# Patient Record
Sex: Female | Born: 1935 | Race: Black or African American | Hispanic: No | Smoking: Never smoker
Health system: Southern US, Community
[De-identification: ages and names within clinical notes are randomized; demographics above are authoritative.]

## PROBLEM LIST (undated history)

## (undated) DIAGNOSIS — Z9981 Dependence on supplemental oxygen: Secondary | ICD-10-CM

## (undated) DIAGNOSIS — N183 Chronic kidney disease, stage 3 unspecified: Secondary | ICD-10-CM

## (undated) DIAGNOSIS — Z8719 Personal history of other diseases of the digestive system: Secondary | ICD-10-CM

## (undated) DIAGNOSIS — J449 Chronic obstructive pulmonary disease, unspecified: Secondary | ICD-10-CM

## (undated) DIAGNOSIS — K219 Gastro-esophageal reflux disease without esophagitis: Secondary | ICD-10-CM

## (undated) DIAGNOSIS — L89309 Pressure ulcer of unspecified buttock, unspecified stage: Secondary | ICD-10-CM

## (undated) DIAGNOSIS — J189 Pneumonia, unspecified organism: Secondary | ICD-10-CM

## (undated) DIAGNOSIS — I10 Essential (primary) hypertension: Secondary | ICD-10-CM

## (undated) DIAGNOSIS — D649 Anemia, unspecified: Secondary | ICD-10-CM

## (undated) DIAGNOSIS — Z8711 Personal history of peptic ulcer disease: Secondary | ICD-10-CM

## (undated) DIAGNOSIS — F329 Major depressive disorder, single episode, unspecified: Secondary | ICD-10-CM

## (undated) DIAGNOSIS — R0602 Shortness of breath: Secondary | ICD-10-CM

## (undated) DIAGNOSIS — G8929 Other chronic pain: Secondary | ICD-10-CM

## (undated) DIAGNOSIS — M6281 Muscle weakness (generalized): Secondary | ICD-10-CM

## (undated) DIAGNOSIS — I509 Heart failure, unspecified: Secondary | ICD-10-CM

## (undated) DIAGNOSIS — I82409 Acute embolism and thrombosis of unspecified deep veins of unspecified lower extremity: Secondary | ICD-10-CM

## (undated) DIAGNOSIS — F419 Anxiety disorder, unspecified: Secondary | ICD-10-CM

## (undated) DIAGNOSIS — E119 Type 2 diabetes mellitus without complications: Secondary | ICD-10-CM

## (undated) DIAGNOSIS — Z9289 Personal history of other medical treatment: Secondary | ICD-10-CM

## (undated) DIAGNOSIS — F0390 Unspecified dementia without behavioral disturbance: Secondary | ICD-10-CM

## (undated) DIAGNOSIS — F32A Depression, unspecified: Secondary | ICD-10-CM

## (undated) DIAGNOSIS — R0902 Hypoxemia: Secondary | ICD-10-CM

## (undated) DIAGNOSIS — M1990 Unspecified osteoarthritis, unspecified site: Secondary | ICD-10-CM

## (undated) HISTORY — DX: Hypoxemia: R09.02

## (undated) HISTORY — DX: Muscle weakness (generalized): M62.81

## (undated) HISTORY — DX: Major depressive disorder, single episode, unspecified: F32.9

## (undated) HISTORY — DX: Gastro-esophageal reflux disease without esophagitis: K21.9

## (undated) HISTORY — PX: HERNIA REPAIR: SHX51

## (undated) HISTORY — DX: Morbid (severe) obesity due to excess calories: E66.01

## (undated) HISTORY — DX: Depression, unspecified: F32.A

## (undated) HISTORY — DX: Anemia, unspecified: D64.9

## (undated) HISTORY — DX: Other chronic pain: G89.29

## (undated) HISTORY — PX: CHOLECYSTECTOMY: SHX55

## (undated) HISTORY — PX: ABDOMINAL HYSTERECTOMY: SHX81

## (undated) HISTORY — PX: TUBAL LIGATION: SHX77

---

## 2001-09-13 ENCOUNTER — Encounter: Payer: Self-pay | Admitting: *Deleted

## 2001-09-13 ENCOUNTER — Emergency Department (HOSPITAL_COMMUNITY): Admission: EM | Admit: 2001-09-13 | Discharge: 2001-09-13 | Payer: Self-pay | Admitting: *Deleted

## 2001-12-19 ENCOUNTER — Ambulatory Visit (HOSPITAL_COMMUNITY): Admission: RE | Admit: 2001-12-19 | Discharge: 2001-12-19 | Payer: Self-pay | Admitting: Internal Medicine

## 2001-12-19 ENCOUNTER — Encounter: Payer: Self-pay | Admitting: Internal Medicine

## 2001-12-28 ENCOUNTER — Encounter: Payer: Self-pay | Admitting: Internal Medicine

## 2001-12-28 ENCOUNTER — Emergency Department (HOSPITAL_COMMUNITY): Admission: EM | Admit: 2001-12-28 | Discharge: 2001-12-29 | Payer: Self-pay | Admitting: Internal Medicine

## 2002-12-20 ENCOUNTER — Ambulatory Visit (HOSPITAL_COMMUNITY): Admission: RE | Admit: 2002-12-20 | Discharge: 2002-12-20 | Payer: Self-pay | Admitting: Internal Medicine

## 2002-12-20 ENCOUNTER — Encounter: Payer: Self-pay | Admitting: Internal Medicine

## 2003-02-26 ENCOUNTER — Encounter: Payer: Self-pay | Admitting: Internal Medicine

## 2003-02-26 ENCOUNTER — Ambulatory Visit (HOSPITAL_COMMUNITY): Admission: RE | Admit: 2003-02-26 | Discharge: 2003-02-26 | Payer: Self-pay | Admitting: Internal Medicine

## 2003-03-09 ENCOUNTER — Inpatient Hospital Stay (HOSPITAL_COMMUNITY): Admission: EM | Admit: 2003-03-09 | Discharge: 2003-03-15 | Payer: Self-pay | Admitting: Emergency Medicine

## 2003-03-14 ENCOUNTER — Encounter: Payer: Self-pay | Admitting: Internal Medicine

## 2003-04-03 ENCOUNTER — Emergency Department (HOSPITAL_COMMUNITY): Admission: EM | Admit: 2003-04-03 | Discharge: 2003-04-03 | Payer: Self-pay | Admitting: *Deleted

## 2003-04-04 ENCOUNTER — Encounter (HOSPITAL_COMMUNITY): Admission: RE | Admit: 2003-04-04 | Discharge: 2003-04-05 | Payer: Self-pay | Admitting: *Deleted

## 2003-04-05 ENCOUNTER — Ambulatory Visit (HOSPITAL_COMMUNITY): Admission: RE | Admit: 2003-04-05 | Discharge: 2003-04-05 | Payer: Self-pay | Admitting: Internal Medicine

## 2003-04-05 ENCOUNTER — Encounter: Payer: Self-pay | Admitting: *Deleted

## 2003-06-04 ENCOUNTER — Inpatient Hospital Stay (HOSPITAL_COMMUNITY): Admission: EM | Admit: 2003-06-04 | Discharge: 2003-06-11 | Payer: Self-pay | Admitting: *Deleted

## 2003-12-25 ENCOUNTER — Encounter (HOSPITAL_COMMUNITY): Admission: RE | Admit: 2003-12-25 | Discharge: 2003-12-26 | Payer: Self-pay | Admitting: *Deleted

## 2004-02-28 ENCOUNTER — Ambulatory Visit (HOSPITAL_COMMUNITY): Admission: RE | Admit: 2004-02-28 | Discharge: 2004-02-28 | Payer: Self-pay | Admitting: Urology

## 2004-06-27 ENCOUNTER — Inpatient Hospital Stay (HOSPITAL_COMMUNITY): Admission: EM | Admit: 2004-06-27 | Discharge: 2004-07-01 | Payer: Self-pay | Admitting: Emergency Medicine

## 2004-07-04 ENCOUNTER — Emergency Department (HOSPITAL_COMMUNITY): Admission: EM | Admit: 2004-07-04 | Discharge: 2004-07-04 | Payer: Self-pay | Admitting: Emergency Medicine

## 2004-07-21 ENCOUNTER — Ambulatory Visit (HOSPITAL_COMMUNITY): Admission: RE | Admit: 2004-07-21 | Discharge: 2004-07-21 | Payer: Self-pay | Admitting: Internal Medicine

## 2004-07-30 ENCOUNTER — Ambulatory Visit: Payer: Self-pay | Admitting: Internal Medicine

## 2004-08-13 ENCOUNTER — Ambulatory Visit: Payer: Self-pay | Admitting: Internal Medicine

## 2004-08-13 ENCOUNTER — Ambulatory Visit (HOSPITAL_COMMUNITY): Admission: RE | Admit: 2004-08-13 | Discharge: 2004-08-13 | Payer: Self-pay | Admitting: Internal Medicine

## 2004-09-16 ENCOUNTER — Ambulatory Visit: Payer: Self-pay | Admitting: Internal Medicine

## 2004-10-03 ENCOUNTER — Ambulatory Visit: Payer: Self-pay | Admitting: Internal Medicine

## 2005-01-29 ENCOUNTER — Ambulatory Visit (HOSPITAL_COMMUNITY): Admission: RE | Admit: 2005-01-29 | Discharge: 2005-01-29 | Payer: Self-pay | Admitting: Internal Medicine

## 2005-03-23 ENCOUNTER — Ambulatory Visit (HOSPITAL_COMMUNITY): Payer: Self-pay | Admitting: Oncology

## 2005-03-23 ENCOUNTER — Encounter (HOSPITAL_COMMUNITY): Admission: RE | Admit: 2005-03-23 | Discharge: 2005-03-28 | Payer: Self-pay | Admitting: Oncology

## 2005-03-23 ENCOUNTER — Encounter: Admission: RE | Admit: 2005-03-23 | Discharge: 2005-03-28 | Payer: Self-pay

## 2005-05-25 ENCOUNTER — Encounter (HOSPITAL_COMMUNITY): Admission: RE | Admit: 2005-05-25 | Discharge: 2005-06-24 | Payer: Self-pay | Admitting: Oncology

## 2005-05-25 ENCOUNTER — Ambulatory Visit (HOSPITAL_COMMUNITY): Payer: Self-pay | Admitting: Oncology

## 2005-05-25 ENCOUNTER — Encounter: Admission: RE | Admit: 2005-05-25 | Discharge: 2005-05-25 | Payer: Self-pay | Admitting: Oncology

## 2005-07-21 ENCOUNTER — Ambulatory Visit (HOSPITAL_COMMUNITY): Admission: RE | Admit: 2005-07-21 | Discharge: 2005-07-21 | Payer: Self-pay | Admitting: General Surgery

## 2005-07-27 ENCOUNTER — Ambulatory Visit (HOSPITAL_COMMUNITY): Payer: Self-pay | Admitting: Oncology

## 2005-07-27 ENCOUNTER — Encounter: Admission: RE | Admit: 2005-07-27 | Discharge: 2005-07-27 | Payer: Self-pay | Admitting: Oncology

## 2005-08-06 ENCOUNTER — Ambulatory Visit (HOSPITAL_COMMUNITY): Admission: RE | Admit: 2005-08-06 | Discharge: 2005-08-06 | Payer: Self-pay | Admitting: Internal Medicine

## 2005-08-20 ENCOUNTER — Ambulatory Visit (HOSPITAL_COMMUNITY): Admission: RE | Admit: 2005-08-20 | Discharge: 2005-08-20 | Payer: Self-pay | Admitting: Internal Medicine

## 2005-09-15 ENCOUNTER — Ambulatory Visit (HOSPITAL_COMMUNITY): Payer: Self-pay | Admitting: Oncology

## 2005-09-15 ENCOUNTER — Encounter (HOSPITAL_COMMUNITY): Admission: RE | Admit: 2005-09-15 | Discharge: 2005-10-15 | Payer: Self-pay | Admitting: Oncology

## 2005-09-15 ENCOUNTER — Encounter: Admission: RE | Admit: 2005-09-15 | Discharge: 2005-09-15 | Payer: Self-pay | Admitting: Oncology

## 2005-10-29 ENCOUNTER — Encounter: Admission: RE | Admit: 2005-10-29 | Discharge: 2005-10-29 | Payer: Self-pay | Admitting: Oncology

## 2005-12-01 ENCOUNTER — Encounter (HOSPITAL_COMMUNITY): Admission: RE | Admit: 2005-12-01 | Discharge: 2005-12-31 | Payer: Self-pay | Admitting: Oncology

## 2005-12-01 ENCOUNTER — Encounter: Admission: RE | Admit: 2005-12-01 | Discharge: 2005-12-01 | Payer: Self-pay | Admitting: Oncology

## 2005-12-01 ENCOUNTER — Ambulatory Visit (HOSPITAL_COMMUNITY): Payer: Self-pay | Admitting: Oncology

## 2006-01-13 ENCOUNTER — Encounter: Admission: RE | Admit: 2006-01-13 | Discharge: 2006-01-13 | Payer: Self-pay | Admitting: Oncology

## 2006-02-10 ENCOUNTER — Ambulatory Visit (HOSPITAL_COMMUNITY): Admission: RE | Admit: 2006-02-10 | Discharge: 2006-02-10 | Payer: Self-pay | Admitting: Internal Medicine

## 2006-02-20 IMAGING — CR DG CHEST 1V PORT
1 series · 1 of 1 positions shown · non-contrast
Comparison: 07/04/04.

CLINICAL DATA: Port-A-Cath insertion.
 PORTABLE CHEST ? 1 VIEW ? 07/21/05 AT 2760 HOURS:

[view not recorded]
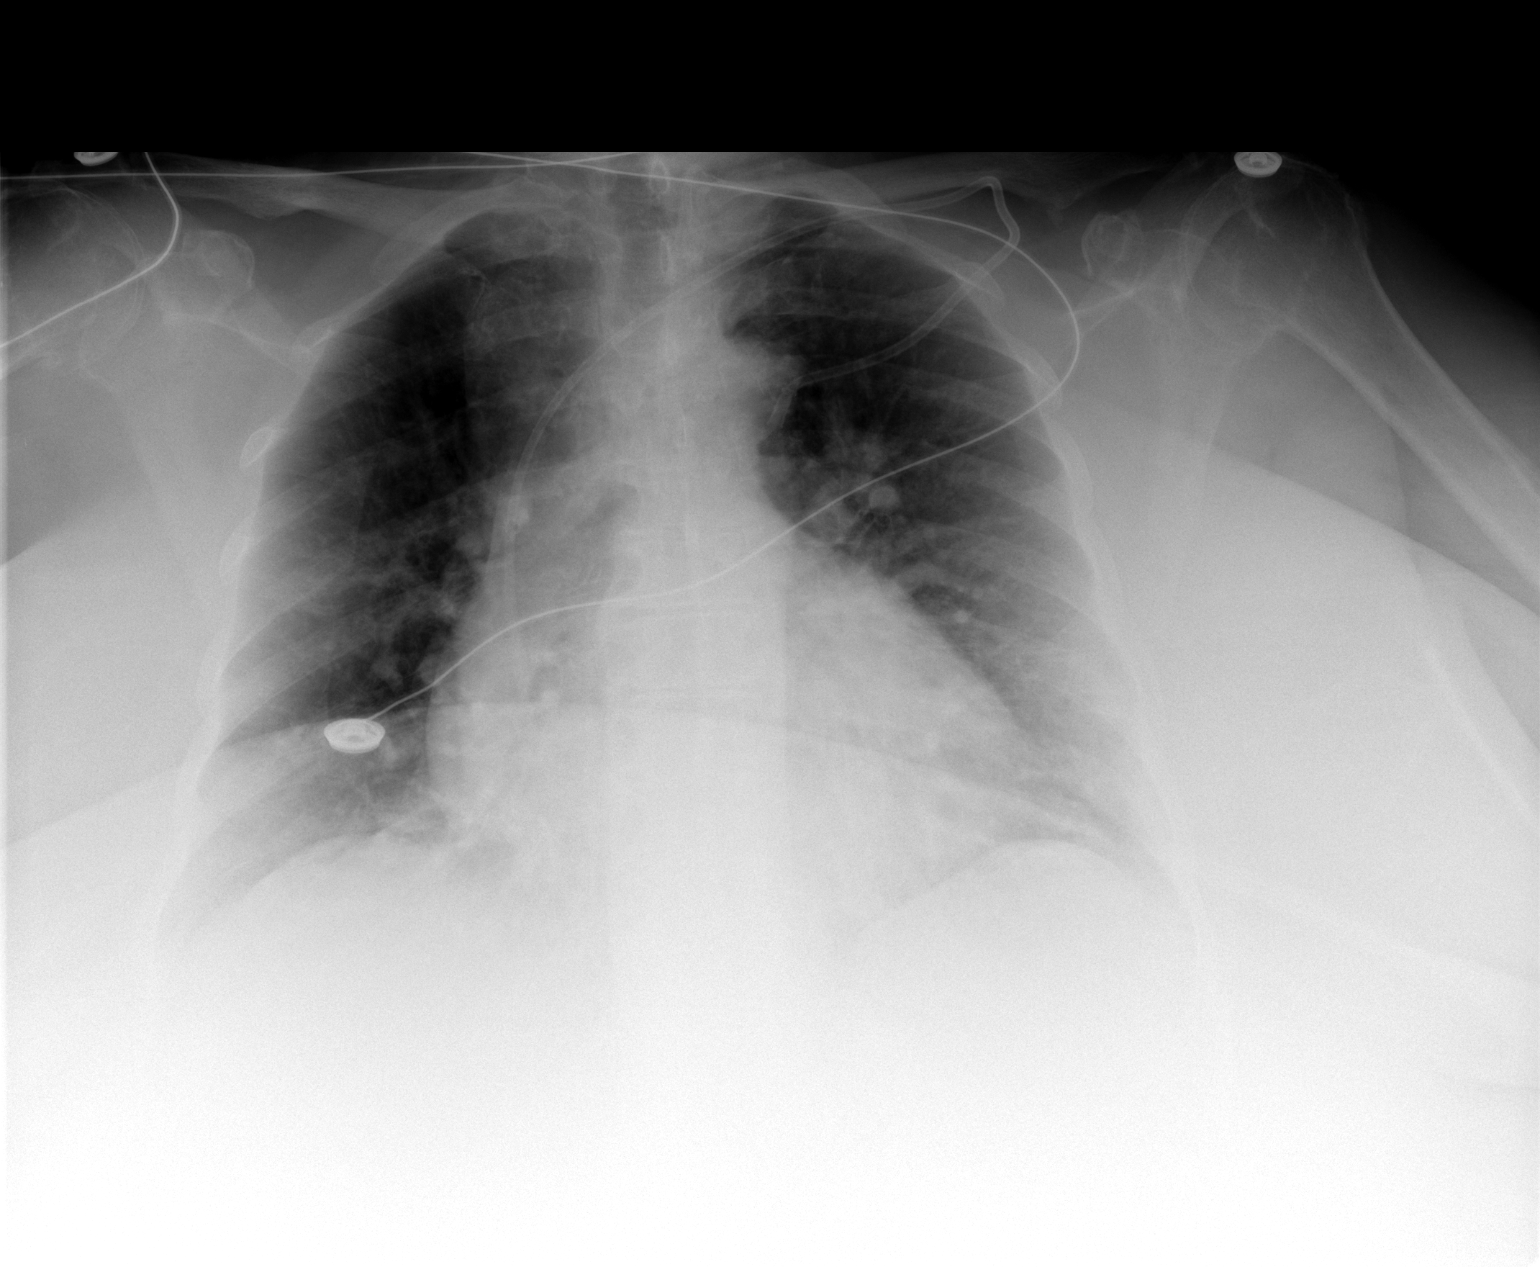

[1 of 1 positions shown; findings below may reference images not displayed]

FINDINGS: A left subclavian Port-A-Cath has been placed with the tip at the cavoatrial junction.  There is no pneumothorax.  
 The heart is enlarged.  There is pulmonary vascular congestion similar to the prior study.
IMPRESSION: 1.  Port-A-Cath placement in satisfactory position as above.  
 2.  Pulmonary vascular congestion again noted and unchanged.

## 2006-02-23 ENCOUNTER — Emergency Department (HOSPITAL_COMMUNITY): Admission: EM | Admit: 2006-02-23 | Discharge: 2006-02-23 | Payer: Self-pay | Admitting: Emergency Medicine

## 2006-03-10 ENCOUNTER — Ambulatory Visit (HOSPITAL_COMMUNITY): Payer: Self-pay | Admitting: Oncology

## 2006-03-10 ENCOUNTER — Ambulatory Visit (HOSPITAL_COMMUNITY): Admission: RE | Admit: 2006-03-10 | Discharge: 2006-03-10 | Payer: Self-pay | Admitting: Internal Medicine

## 2006-03-10 ENCOUNTER — Encounter: Admission: RE | Admit: 2006-03-10 | Discharge: 2006-03-26 | Payer: Self-pay | Admitting: Oncology

## 2006-03-10 ENCOUNTER — Encounter (HOSPITAL_COMMUNITY): Admission: RE | Admit: 2006-03-10 | Discharge: 2006-03-26 | Payer: Self-pay | Admitting: Oncology

## 2006-03-17 ENCOUNTER — Ambulatory Visit (HOSPITAL_COMMUNITY): Admission: RE | Admit: 2006-03-17 | Discharge: 2006-03-17 | Payer: Self-pay | Admitting: Internal Medicine

## 2006-04-21 ENCOUNTER — Encounter: Admission: RE | Admit: 2006-04-21 | Discharge: 2006-04-21 | Payer: Self-pay | Admitting: Oncology

## 2006-06-02 ENCOUNTER — Ambulatory Visit (HOSPITAL_COMMUNITY): Payer: Self-pay | Admitting: Oncology

## 2006-08-25 ENCOUNTER — Ambulatory Visit (HOSPITAL_COMMUNITY): Payer: Self-pay | Admitting: Oncology

## 2006-10-06 ENCOUNTER — Encounter (HOSPITAL_COMMUNITY): Admission: RE | Admit: 2006-10-06 | Discharge: 2006-11-05 | Payer: Self-pay | Admitting: Oncology

## 2006-11-16 ENCOUNTER — Ambulatory Visit (HOSPITAL_COMMUNITY): Payer: Self-pay | Admitting: Oncology

## 2006-11-23 ENCOUNTER — Emergency Department (HOSPITAL_COMMUNITY): Admission: EM | Admit: 2006-11-23 | Discharge: 2006-11-23 | Payer: Self-pay | Admitting: Emergency Medicine

## 2007-01-06 ENCOUNTER — Ambulatory Visit: Payer: Self-pay | Admitting: Internal Medicine

## 2007-01-13 ENCOUNTER — Ambulatory Visit: Payer: Self-pay | Admitting: Gastroenterology

## 2007-01-19 ENCOUNTER — Ambulatory Visit (HOSPITAL_COMMUNITY): Admission: RE | Admit: 2007-01-19 | Discharge: 2007-01-19 | Payer: Self-pay | Admitting: Internal Medicine

## 2007-02-08 ENCOUNTER — Ambulatory Visit (HOSPITAL_COMMUNITY): Payer: Self-pay | Admitting: Oncology

## 2007-02-08 ENCOUNTER — Encounter (HOSPITAL_COMMUNITY): Admission: RE | Admit: 2007-02-08 | Discharge: 2007-03-10 | Payer: Self-pay | Admitting: Oncology

## 2007-02-14 ENCOUNTER — Ambulatory Visit (HOSPITAL_COMMUNITY): Admission: RE | Admit: 2007-02-14 | Discharge: 2007-02-14 | Payer: Self-pay | Admitting: Internal Medicine

## 2007-02-25 ENCOUNTER — Ambulatory Visit: Payer: Self-pay | Admitting: Internal Medicine

## 2007-03-07 ENCOUNTER — Emergency Department (HOSPITAL_COMMUNITY): Admission: EM | Admit: 2007-03-07 | Discharge: 2007-03-07 | Payer: Self-pay | Admitting: *Deleted

## 2007-03-13 ENCOUNTER — Inpatient Hospital Stay (HOSPITAL_COMMUNITY): Admission: EM | Admit: 2007-03-13 | Discharge: 2007-03-18 | Payer: Self-pay | Admitting: Emergency Medicine

## 2007-03-16 ENCOUNTER — Ambulatory Visit: Payer: Self-pay | Admitting: Oncology

## 2007-03-18 ENCOUNTER — Ambulatory Visit: Payer: Self-pay | Admitting: Internal Medicine

## 2007-06-03 ENCOUNTER — Emergency Department (HOSPITAL_COMMUNITY): Admission: EM | Admit: 2007-06-03 | Discharge: 2007-06-03 | Payer: Self-pay | Admitting: Emergency Medicine

## 2008-05-20 DIAGNOSIS — K279 Peptic ulcer, site unspecified, unspecified as acute or chronic, without hemorrhage or perforation: Secondary | ICD-10-CM | POA: Insufficient documentation

## 2008-05-20 DIAGNOSIS — E119 Type 2 diabetes mellitus without complications: Secondary | ICD-10-CM

## 2008-05-20 DIAGNOSIS — I10 Essential (primary) hypertension: Secondary | ICD-10-CM | POA: Insufficient documentation

## 2008-05-20 DIAGNOSIS — K219 Gastro-esophageal reflux disease without esophagitis: Secondary | ICD-10-CM

## 2008-05-20 DIAGNOSIS — R197 Diarrhea, unspecified: Secondary | ICD-10-CM | POA: Insufficient documentation

## 2008-05-20 DIAGNOSIS — D509 Iron deficiency anemia, unspecified: Secondary | ICD-10-CM

## 2008-05-20 DIAGNOSIS — O039 Complete or unspecified spontaneous abortion without complication: Secondary | ICD-10-CM | POA: Insufficient documentation

## 2008-05-20 DIAGNOSIS — R062 Wheezing: Secondary | ICD-10-CM | POA: Insufficient documentation

## 2008-05-20 DIAGNOSIS — M1990 Unspecified osteoarthritis, unspecified site: Secondary | ICD-10-CM | POA: Insufficient documentation

## 2008-05-20 DIAGNOSIS — K449 Diaphragmatic hernia without obstruction or gangrene: Secondary | ICD-10-CM | POA: Insufficient documentation

## 2008-05-20 DIAGNOSIS — J984 Other disorders of lung: Secondary | ICD-10-CM | POA: Insufficient documentation

## 2008-05-20 DIAGNOSIS — K5730 Diverticulosis of large intestine without perforation or abscess without bleeding: Secondary | ICD-10-CM | POA: Insufficient documentation

## 2008-05-20 DIAGNOSIS — R609 Edema, unspecified: Secondary | ICD-10-CM | POA: Insufficient documentation

## 2008-05-20 DIAGNOSIS — I359 Nonrheumatic aortic valve disorder, unspecified: Secondary | ICD-10-CM | POA: Insufficient documentation

## 2008-05-20 DIAGNOSIS — D126 Benign neoplasm of colon, unspecified: Secondary | ICD-10-CM

## 2008-05-20 DIAGNOSIS — R112 Nausea with vomiting, unspecified: Secondary | ICD-10-CM

## 2009-11-05 ENCOUNTER — Inpatient Hospital Stay (HOSPITAL_COMMUNITY): Admission: EM | Admit: 2009-11-05 | Discharge: 2009-11-23 | Payer: Self-pay | Admitting: Emergency Medicine

## 2009-11-06 ENCOUNTER — Ambulatory Visit: Payer: Self-pay | Admitting: Gastroenterology

## 2009-11-07 ENCOUNTER — Ambulatory Visit: Payer: Self-pay | Admitting: Internal Medicine

## 2009-11-08 ENCOUNTER — Ambulatory Visit: Payer: Self-pay | Admitting: Internal Medicine

## 2009-11-11 ENCOUNTER — Ambulatory Visit: Payer: Self-pay | Admitting: Internal Medicine

## 2009-11-11 ENCOUNTER — Encounter: Payer: Self-pay | Admitting: Internal Medicine

## 2009-11-13 ENCOUNTER — Ambulatory Visit: Payer: Self-pay | Admitting: Gastroenterology

## 2009-11-14 ENCOUNTER — Ambulatory Visit: Payer: Self-pay | Admitting: Infectious Diseases

## 2009-11-20 ENCOUNTER — Encounter: Payer: Self-pay | Admitting: Internal Medicine

## 2010-07-19 ENCOUNTER — Encounter (HOSPITAL_COMMUNITY): Payer: Self-pay | Admitting: Oncology

## 2010-07-20 ENCOUNTER — Encounter: Payer: Self-pay | Admitting: Internal Medicine

## 2010-07-29 NOTE — Letter (Signed)
Summary: External Other  External Other   Imported By: Peggyann Shoals 11/20/2009 09:02:11  _____________________________________________________________________  External Attachment:    Type:   Image     Comment:   External Document

## 2010-09-15 LAB — GLUCOSE, CAPILLARY
Glucose-Capillary: 142 mg/dL — ABNORMAL HIGH (ref 70–99)
Glucose-Capillary: 151 mg/dL — ABNORMAL HIGH (ref 70–99)
Glucose-Capillary: 191 mg/dL — ABNORMAL HIGH (ref 70–99)
Glucose-Capillary: 237 mg/dL — ABNORMAL HIGH (ref 70–99)
Glucose-Capillary: 371 mg/dL — ABNORMAL HIGH (ref 70–99)
Glucose-Capillary: 106 mg/dL — ABNORMAL HIGH (ref 70–99)
Glucose-Capillary: 110 mg/dL — ABNORMAL HIGH (ref 70–99)
Glucose-Capillary: 113 mg/dL — ABNORMAL HIGH (ref 70–99)
Glucose-Capillary: 116 mg/dL — ABNORMAL HIGH (ref 70–99)
Glucose-Capillary: 117 mg/dL — ABNORMAL HIGH (ref 70–99)
Glucose-Capillary: 120 mg/dL — ABNORMAL HIGH (ref 70–99)
Glucose-Capillary: 127 mg/dL — ABNORMAL HIGH (ref 70–99)
Glucose-Capillary: 128 mg/dL — ABNORMAL HIGH (ref 70–99)
Glucose-Capillary: 129 mg/dL — ABNORMAL HIGH (ref 70–99)
Glucose-Capillary: 138 mg/dL — ABNORMAL HIGH (ref 70–99)
Glucose-Capillary: 145 mg/dL — ABNORMAL HIGH (ref 70–99)
Glucose-Capillary: 147 mg/dL — ABNORMAL HIGH (ref 70–99)
Glucose-Capillary: 152 mg/dL — ABNORMAL HIGH (ref 70–99)
Glucose-Capillary: 152 mg/dL — ABNORMAL HIGH (ref 70–99)
Glucose-Capillary: 169 mg/dL — ABNORMAL HIGH (ref 70–99)
Glucose-Capillary: 231 mg/dL — ABNORMAL HIGH (ref 70–99)
Glucose-Capillary: 234 mg/dL — ABNORMAL HIGH (ref 70–99)
Glucose-Capillary: 234 mg/dL — ABNORMAL HIGH (ref 70–99)
Glucose-Capillary: 86 mg/dL (ref 70–99)
Glucose-Capillary: 91 mg/dL (ref 70–99)
Glucose-Capillary: 91 mg/dL (ref 70–99)
Glucose-Capillary: 93 mg/dL (ref 70–99)
Glucose-Capillary: 97 mg/dL (ref 70–99)
Glucose-Capillary: 98 mg/dL (ref 70–99)

## 2010-09-15 LAB — BASIC METABOLIC PANEL
BUN: 54 mg/dL — ABNORMAL HIGH (ref 6–23)
BUN: 69 mg/dL — ABNORMAL HIGH (ref 6–23)
BUN: 16 mg/dL (ref 6–23)
BUN: 17 mg/dL (ref 6–23)
BUN: 21 mg/dL (ref 6–23)
BUN: 26 mg/dL — ABNORMAL HIGH (ref 6–23)
CO2: 29 mEq/L (ref 19–32)
CO2: 31 mEq/L (ref 19–32)
CO2: 25 mEq/L (ref 19–32)
CO2: 27 mEq/L (ref 19–32)
CO2: 28 mEq/L (ref 19–32)
CO2: 30 mEq/L (ref 19–32)
CO2: 30 mEq/L (ref 19–32)
Calcium: 7.8 mg/dL — ABNORMAL LOW (ref 8.4–10.5)
Calcium: 8.1 mg/dL — ABNORMAL LOW (ref 8.4–10.5)
Calcium: 7.5 mg/dL — ABNORMAL LOW (ref 8.4–10.5)
Calcium: 7.8 mg/dL — ABNORMAL LOW (ref 8.4–10.5)
Calcium: 8 mg/dL — ABNORMAL LOW (ref 8.4–10.5)
Calcium: 8 mg/dL — ABNORMAL LOW (ref 8.4–10.5)
Calcium: 8.1 mg/dL — ABNORMAL LOW (ref 8.4–10.5)
Calcium: 8.5 mg/dL (ref 8.4–10.5)
Chloride: 109 mEq/L (ref 96–112)
Chloride: 110 mEq/L (ref 96–112)
Chloride: 105 mEq/L (ref 96–112)
Chloride: 106 mEq/L (ref 96–112)
Chloride: 107 mEq/L (ref 96–112)
Chloride: 110 mEq/L (ref 96–112)
Creatinine, Ser: 1.34 mg/dL — ABNORMAL HIGH (ref 0.4–1.2)
Creatinine, Ser: 1.4 mg/dL — ABNORMAL HIGH (ref 0.4–1.2)
Creatinine, Ser: 1.46 mg/dL — ABNORMAL HIGH (ref 0.4–1.2)
Creatinine, Ser: 1.48 mg/dL — ABNORMAL HIGH (ref 0.4–1.2)
Creatinine, Ser: 1.58 mg/dL — ABNORMAL HIGH (ref 0.4–1.2)
GFR calc Af Amer: 44 mL/min — ABNORMAL LOW (ref >60)
GFR calc Af Amer: 46 mL/min — ABNORMAL LOW (ref >60)
GFR calc Af Amer: 38 mL/min — ABNORMAL LOW (ref >60)
GFR calc Af Amer: 41 mL/min — ABNORMAL LOW (ref >60)
GFR calc Af Amer: 42 mL/min — ABNORMAL LOW (ref >60)
GFR calc Af Amer: 42 mL/min — ABNORMAL LOW (ref >60)
GFR calc Af Amer: 42 mL/min — ABNORMAL LOW (ref >60)
GFR calc non Af Amer: 36 mL/min — ABNORMAL LOW (ref >60)
GFR calc non Af Amer: 38 mL/min — ABNORMAL LOW (ref >60)
GFR calc non Af Amer: 28 mL/min — ABNORMAL LOW (ref >60)
GFR calc non Af Amer: 32 mL/min — ABNORMAL LOW (ref >60)
GFR calc non Af Amer: 35 mL/min — ABNORMAL LOW (ref >60)
GFR calc non Af Amer: 35 mL/min — ABNORMAL LOW (ref >60)
GFR calc non Af Amer: 36 mL/min — ABNORMAL LOW (ref >60)
GFR calc non Af Amer: 40 mL/min — ABNORMAL LOW (ref >60)
Glucose, Bld: 141 mg/dL — ABNORMAL HIGH (ref 70–99)
Glucose, Bld: 213 mg/dL — ABNORMAL HIGH (ref 70–99)
Glucose, Bld: 100 mg/dL — ABNORMAL HIGH (ref 70–99)
Glucose, Bld: 113 mg/dL — ABNORMAL HIGH (ref 70–99)
Glucose, Bld: 117 mg/dL — ABNORMAL HIGH (ref 70–99)
Glucose, Bld: 121 mg/dL — ABNORMAL HIGH (ref 70–99)
Glucose, Bld: 133 mg/dL — ABNORMAL HIGH (ref 70–99)
Potassium: 3.5 mEq/L (ref 3.5–5.1)
Potassium: 3.6 mEq/L (ref 3.5–5.1)
Potassium: 3.4 mEq/L — ABNORMAL LOW (ref 3.5–5.1)
Potassium: 3.5 mEq/L (ref 3.5–5.1)
Potassium: 3.7 mEq/L (ref 3.5–5.1)
Potassium: 4.1 mEq/L (ref 3.5–5.1)
Potassium: 4.1 mEq/L (ref 3.5–5.1)
Potassium: 4.1 mEq/L (ref 3.5–5.1)
Potassium: 4.1 mEq/L (ref 3.5–5.1)
Potassium: 4.3 mEq/L (ref 3.5–5.1)
Sodium: 141 mEq/L (ref 135–145)
Sodium: 145 mEq/L (ref 135–145)
Sodium: 139 mEq/L (ref 135–145)
Sodium: 140 mEq/L (ref 135–145)
Sodium: 142 mEq/L (ref 135–145)
Sodium: 143 mEq/L (ref 135–145)
Sodium: 143 mEq/L (ref 135–145)

## 2010-09-15 LAB — GRAM STAIN

## 2010-09-15 LAB — CBC
HCT: 15.9 % — ABNORMAL LOW (ref 36.0–46.0)
HCT: 20.8 % — ABNORMAL LOW (ref 36.0–46.0)
HCT: 24 % — ABNORMAL LOW (ref 36.0–46.0)
HCT: 22.4 % — ABNORMAL LOW (ref 36.0–46.0)
HCT: 23 % — ABNORMAL LOW (ref 36.0–46.0)
HCT: 27 % — ABNORMAL LOW (ref 36.0–46.0)
HCT: 28.3 % — ABNORMAL LOW (ref 36.0–46.0)
HCT: 28.6 % — ABNORMAL LOW (ref 36.0–46.0)
Hemoglobin: 5.5 g/dL — CL (ref 12.0–15.0)
Hemoglobin: 7.5 g/dL — ABNORMAL LOW (ref 12.0–15.0)
Hemoglobin: 8.4 g/dL — ABNORMAL LOW (ref 12.0–15.0)
Hemoglobin: 7.9 g/dL — ABNORMAL LOW (ref 12.0–15.0)
Hemoglobin: 9 g/dL — ABNORMAL LOW (ref 12.0–15.0)
Hemoglobin: 9.1 g/dL — ABNORMAL LOW (ref 12.0–15.0)
Hemoglobin: 9.3 g/dL — ABNORMAL LOW (ref 12.0–15.0)
Hemoglobin: 9.4 g/dL — ABNORMAL LOW (ref 12.0–15.0)
MCHC: 34.8 g/dL (ref 30.0–36.0)
MCHC: 35.1 g/dL (ref 30.0–36.0)
MCHC: 36 g/dL (ref 30.0–36.0)
MCHC: 34 g/dL (ref 30.0–36.0)
MCHC: 34.3 g/dL (ref 30.0–36.0)
MCHC: 34.5 g/dL (ref 30.0–36.0)
MCV: 88.7 fL (ref 78.0–100.0)
MCV: 89.4 fL (ref 78.0–100.0)
MCV: 90.2 fL (ref 78.0–100.0)
MCV: 92.2 fL (ref 78.0–100.0)
MCV: 92.2 fL (ref 78.0–100.0)
MCV: 92.5 fL (ref 78.0–100.0)
Platelets: 129 10*3/uL — ABNORMAL LOW (ref 150–400)
Platelets: 130 10*3/uL — ABNORMAL LOW (ref 150–400)
Platelets: 133 10*3/uL — ABNORMAL LOW (ref 150–400)
Platelets: 152 10*3/uL (ref 150–400)
Platelets: 191 10*3/uL (ref 150–400)
Platelets: 204 10*3/uL (ref 150–400)
Platelets: 208 10*3/uL (ref 150–400)
Platelets: 210 10*3/uL (ref 150–400)
RBC: 1.76 MIL/uL — ABNORMAL LOW (ref 3.87–5.11)
RBC: 2.32 MIL/uL — ABNORMAL LOW (ref 3.87–5.11)
RBC: 2.71 MIL/uL — ABNORMAL LOW (ref 3.87–5.11)
RBC: 2.9 MIL/uL — ABNORMAL LOW (ref 3.87–5.11)
RBC: 3.04 MIL/uL — ABNORMAL LOW (ref 3.87–5.11)
RBC: 3.08 MIL/uL — ABNORMAL LOW (ref 3.87–5.11)
RDW: 15 % (ref 11.5–15.5)
RDW: 15.4 % (ref 11.5–15.5)
RDW: 16.4 % — ABNORMAL HIGH (ref 11.5–15.5)
RDW: 14.6 % (ref 11.5–15.5)
RDW: 15.1 % (ref 11.5–15.5)
RDW: 15.1 % (ref 11.5–15.5)
RDW: 15.2 % (ref 11.5–15.5)
RDW: 15.4 % (ref 11.5–15.5)
RDW: 15.4 % (ref 11.5–15.5)
WBC: 10.3 10*3/uL (ref 4.0–10.5)
WBC: 10.5 10*3/uL (ref 4.0–10.5)
WBC: 10.8 10*3/uL — ABNORMAL HIGH (ref 4.0–10.5)
WBC: 10 10*3/uL (ref 4.0–10.5)
WBC: 10.3 10*3/uL (ref 4.0–10.5)
WBC: 8.4 10*3/uL (ref 4.0–10.5)
WBC: 9.2 10*3/uL (ref 4.0–10.5)
WBC: 9.3 10*3/uL (ref 4.0–10.5)
WBC: 9.7 10*3/uL (ref 4.0–10.5)

## 2010-09-15 LAB — TYPE AND SCREEN
ABO/RH(D): A POS
Antibody Screen: NEGATIVE

## 2010-09-15 LAB — SEDIMENTATION RATE: Sed Rate: 35 mm/hr — ABNORMAL HIGH (ref 0–22)

## 2010-09-15 LAB — CROSSMATCH: ABO/RH(D): A POS

## 2010-09-15 LAB — DIFFERENTIAL
Basophils Absolute: 0 10*3/uL (ref 0.0–0.1)
Basophils Absolute: 0 10*3/uL (ref 0.0–0.1)
Basophils Relative: 0 % (ref 0–1)
Basophils Relative: 0 % (ref 0–1)
Eosinophils Absolute: 0.5 10*3/uL (ref 0.0–0.7)
Eosinophils Relative: 5 % (ref 0–5)
Eosinophils Relative: 7 % — ABNORMAL HIGH (ref 0–5)
Lymphocytes Relative: 6 % — ABNORMAL LOW (ref 12–46)
Lymphs Abs: 0.6 10*3/uL — ABNORMAL LOW (ref 0.7–4.0)
Monocytes Absolute: 0.6 10*3/uL (ref 0.1–1.0)
Monocytes Absolute: 0.8 10*3/uL (ref 0.1–1.0)
Monocytes Relative: 6 % (ref 3–12)
Neutro Abs: 8.6 10*3/uL — ABNORMAL HIGH (ref 1.7–7.7)
Neutrophils Relative %: 84 % — ABNORMAL HIGH (ref 43–77)

## 2010-09-15 LAB — PROTIME-INR
INR: 1.17 (ref 0.00–1.49)
INR: 1.04 (ref 0.00–1.49)
Prothrombin Time: 14.8 seconds (ref 11.6–15.2)

## 2010-09-15 LAB — BODY FLUID CULTURE

## 2010-09-15 LAB — PREPARE RBC (CROSSMATCH)

## 2010-09-15 LAB — ABO/RH: ABO/RH(D): A POS

## 2010-09-15 LAB — APTT: aPTT: 29 seconds (ref 24–37)

## 2010-09-15 LAB — VANCOMYCIN, TROUGH: Vancomycin Tr: 24.3 ug/mL — ABNORMAL HIGH (ref 10.0–20.0)

## 2010-09-15 LAB — HEMOGLOBIN AND HEMATOCRIT, BLOOD
HCT: 25 % — ABNORMAL LOW (ref 36.0–46.0)
HCT: 25.9 % — ABNORMAL LOW (ref 36.0–46.0)
HCT: 23.1 % — ABNORMAL LOW (ref 36.0–46.0)
Hemoglobin: 9 g/dL — ABNORMAL LOW (ref 12.0–15.0)
Hemoglobin: 9.1 g/dL — ABNORMAL LOW (ref 12.0–15.0)
Hemoglobin: 7.9 g/dL — ABNORMAL LOW (ref 12.0–15.0)

## 2010-09-16 LAB — BASIC METABOLIC PANEL
BUN: 107 mg/dL — ABNORMAL HIGH (ref 6–23)
BUN: 119 mg/dL — ABNORMAL HIGH (ref 6–23)
BUN: 120 mg/dL — ABNORMAL HIGH (ref 6–23)
BUN: 87 mg/dL — ABNORMAL HIGH (ref 6–23)
CO2: 27 mEq/L (ref 19–32)
CO2: 27 mEq/L (ref 19–32)
CO2: 29 mEq/L (ref 19–32)
CO2: 29 mEq/L (ref 19–32)
Calcium: 8.7 mg/dL (ref 8.4–10.5)
Chloride: 102 mEq/L (ref 96–112)
Chloride: 108 mEq/L (ref 96–112)
Chloride: 109 mEq/L (ref 96–112)
Chloride: 99 mEq/L (ref 96–112)
Creatinine, Ser: 1.53 mg/dL — ABNORMAL HIGH (ref 0.4–1.2)
Creatinine, Ser: 1.78 mg/dL — ABNORMAL HIGH (ref 0.4–1.2)
Creatinine, Ser: 2.39 mg/dL — ABNORMAL HIGH (ref 0.4–1.2)
GFR calc Af Amer: 40 mL/min — ABNORMAL LOW (ref >60)
GFR calc non Af Amer: 20 mL/min — ABNORMAL LOW (ref >60)
Glucose, Bld: 169 mg/dL — ABNORMAL HIGH (ref 70–99)
Glucose, Bld: 205 mg/dL — ABNORMAL HIGH (ref 70–99)
Glucose, Bld: 221 mg/dL — ABNORMAL HIGH (ref 70–99)
Glucose, Bld: 444 mg/dL — ABNORMAL HIGH (ref 70–99)
Potassium: 3.3 mEq/L — ABNORMAL LOW (ref 3.5–5.1)
Potassium: 3.6 mEq/L (ref 3.5–5.1)
Potassium: 3.6 mEq/L (ref 3.5–5.1)
Sodium: 137 mEq/L (ref 135–145)
Sodium: 139 mEq/L (ref 135–145)

## 2010-09-16 LAB — HEMOGLOBIN A1C: Mean Plasma Glucose: 157 mg/dL — ABNORMAL HIGH (ref <117)

## 2010-09-16 LAB — GLUCOSE, CAPILLARY
Glucose-Capillary: 134 mg/dL — ABNORMAL HIGH (ref 70–99)
Glucose-Capillary: 150 mg/dL — ABNORMAL HIGH (ref 70–99)
Glucose-Capillary: 150 mg/dL — ABNORMAL HIGH (ref 70–99)
Glucose-Capillary: 192 mg/dL — ABNORMAL HIGH (ref 70–99)
Glucose-Capillary: 208 mg/dL — ABNORMAL HIGH (ref 70–99)
Glucose-Capillary: 221 mg/dL — ABNORMAL HIGH (ref 70–99)
Glucose-Capillary: 236 mg/dL — ABNORMAL HIGH (ref 70–99)
Glucose-Capillary: 240 mg/dL — ABNORMAL HIGH (ref 70–99)
Glucose-Capillary: 243 mg/dL — ABNORMAL HIGH (ref 70–99)
Glucose-Capillary: 270 mg/dL — ABNORMAL HIGH (ref 70–99)
Glucose-Capillary: 283 mg/dL — ABNORMAL HIGH (ref 70–99)
Glucose-Capillary: 289 mg/dL — ABNORMAL HIGH (ref 70–99)
Glucose-Capillary: 293 mg/dL — ABNORMAL HIGH (ref 70–99)
Glucose-Capillary: 317 mg/dL — ABNORMAL HIGH (ref 70–99)
Glucose-Capillary: 336 mg/dL — ABNORMAL HIGH (ref 70–99)
Glucose-Capillary: 344 mg/dL — ABNORMAL HIGH (ref 70–99)
Glucose-Capillary: 382 mg/dL — ABNORMAL HIGH (ref 70–99)
Glucose-Capillary: 444 mg/dL — ABNORMAL HIGH (ref 70–99)
Glucose-Capillary: 462 mg/dL — ABNORMAL HIGH (ref 70–99)
Glucose-Capillary: 471 mg/dL — ABNORMAL HIGH (ref 70–99)
Glucose-Capillary: 480 mg/dL — ABNORMAL HIGH (ref 70–99)

## 2010-09-16 LAB — COMPREHENSIVE METABOLIC PANEL
ALT: 39 U/L — ABNORMAL HIGH (ref 0–35)
Albumin: 2.3 g/dL — ABNORMAL LOW (ref 3.5–5.2)
BUN: 120 mg/dL — ABNORMAL HIGH (ref 6–23)
Calcium: 8.5 mg/dL (ref 8.4–10.5)
Glucose, Bld: 244 mg/dL — ABNORMAL HIGH (ref 70–99)
Potassium: 3.7 mEq/L (ref 3.5–5.1)
Sodium: 137 mEq/L (ref 135–145)
Total Protein: 5.2 g/dL — ABNORMAL LOW (ref 6.0–8.3)

## 2010-09-16 LAB — CBC
HCT: 14.4 % — ABNORMAL LOW (ref 36.0–46.0)
HCT: 18.9 % — ABNORMAL LOW (ref 36.0–46.0)
HCT: 19.3 % — ABNORMAL LOW (ref 36.0–46.0)
HCT: 21 % — ABNORMAL LOW (ref 36.0–46.0)
HCT: 21.6 % — ABNORMAL LOW (ref 36.0–46.0)
HCT: 22.3 % — ABNORMAL LOW (ref 36.0–46.0)
HCT: 25.5 % — ABNORMAL LOW (ref 36.0–46.0)
Hemoglobin: 5.1 g/dL — CL (ref 12.0–15.0)
Hemoglobin: 6.8 g/dL — CL (ref 12.0–15.0)
Hemoglobin: 6.8 g/dL — CL (ref 12.0–15.0)
Hemoglobin: 6.9 g/dL — CL (ref 12.0–15.0)
Hemoglobin: 7.6 g/dL — ABNORMAL LOW (ref 12.0–15.0)
Hemoglobin: 8.9 g/dL — ABNORMAL LOW (ref 12.0–15.0)
Hemoglobin: 9.4 g/dL — ABNORMAL LOW (ref 12.0–15.0)
MCHC: 34.8 g/dL (ref 30.0–36.0)
MCHC: 34.9 g/dL (ref 30.0–36.0)
MCHC: 35 g/dL (ref 30.0–36.0)
MCHC: 35.1 g/dL (ref 30.0–36.0)
MCHC: 35.2 g/dL (ref 30.0–36.0)
MCHC: 35.5 g/dL (ref 30.0–36.0)
MCHC: 36.1 g/dL — ABNORMAL HIGH (ref 30.0–36.0)
MCV: 89.1 fL (ref 78.0–100.0)
MCV: 89.7 fL (ref 78.0–100.0)
MCV: 90.4 fL (ref 78.0–100.0)
MCV: 90.6 fL (ref 78.0–100.0)
MCV: 94.8 fL (ref 78.0–100.0)
Platelets: 137 10*3/uL — ABNORMAL LOW (ref 150–400)
Platelets: 146 10*3/uL — ABNORMAL LOW (ref 150–400)
Platelets: 183 10*3/uL (ref 150–400)
Platelets: 95 10*3/uL — ABNORMAL LOW (ref 150–400)
RBC: 1.52 MIL/uL — ABNORMAL LOW (ref 3.87–5.11)
RBC: 2.12 MIL/uL — ABNORMAL LOW (ref 3.87–5.11)
RBC: 2.14 MIL/uL — ABNORMAL LOW (ref 3.87–5.11)
RBC: 2.15 MIL/uL — ABNORMAL LOW (ref 3.87–5.11)
RBC: 2.15 MIL/uL — ABNORMAL LOW (ref 3.87–5.11)
RBC: 2.32 MIL/uL — ABNORMAL LOW (ref 3.87–5.11)
RBC: 2.55 MIL/uL — ABNORMAL LOW (ref 3.87–5.11)
RBC: 3 MIL/uL — ABNORMAL LOW (ref 3.87–5.11)
RDW: 15.4 % (ref 11.5–15.5)
RDW: 15.8 % — ABNORMAL HIGH (ref 11.5–15.5)
RDW: 15.8 % — ABNORMAL HIGH (ref 11.5–15.5)
RDW: 16.4 % — ABNORMAL HIGH (ref 11.5–15.5)
RDW: 16.7 % — ABNORMAL HIGH (ref 11.5–15.5)
RDW: 16.9 % — ABNORMAL HIGH (ref 11.5–15.5)
RDW: 17.6 % — ABNORMAL HIGH (ref 11.5–15.5)
WBC: 13.6 10*3/uL — ABNORMAL HIGH (ref 4.0–10.5)
WBC: 13.8 10*3/uL — ABNORMAL HIGH (ref 4.0–10.5)
WBC: 18.3 10*3/uL — ABNORMAL HIGH (ref 4.0–10.5)
WBC: 19.2 10*3/uL — ABNORMAL HIGH (ref 4.0–10.5)
WBC: 21.5 10*3/uL — ABNORMAL HIGH (ref 4.0–10.5)

## 2010-09-16 LAB — DIFFERENTIAL
Basophils Absolute: 0 10*3/uL (ref 0.0–0.1)
Basophils Absolute: 0 10*3/uL (ref 0.0–0.1)
Basophils Absolute: 0 10*3/uL (ref 0.0–0.1)
Basophils Absolute: 0.2 10*3/uL — ABNORMAL HIGH (ref 0.0–0.1)
Basophils Absolute: 0.2 10*3/uL — ABNORMAL HIGH (ref 0.0–0.1)
Basophils Relative: 0 % (ref 0–1)
Basophils Relative: 0 % (ref 0–1)
Basophils Relative: 0 % (ref 0–1)
Basophils Relative: 0 % (ref 0–1)
Basophils Relative: 0 % (ref 0–1)
Basophils Relative: 0 % (ref 0–1)
Basophils Relative: 1 % (ref 0–1)
Basophils Relative: 2 % — ABNORMAL HIGH (ref 0–1)
Eosinophils Absolute: 0.2 10*3/uL (ref 0.0–0.7)
Eosinophils Absolute: 0.2 10*3/uL (ref 0.0–0.7)
Eosinophils Absolute: 0.3 10*3/uL (ref 0.0–0.7)
Eosinophils Absolute: 0.3 10*3/uL (ref 0.0–0.7)
Eosinophils Absolute: 0.4 10*3/uL (ref 0.0–0.7)
Eosinophils Absolute: 0.6 10*3/uL (ref 0.0–0.7)
Eosinophils Relative: 1 % (ref 0–5)
Eosinophils Relative: 1 % (ref 0–5)
Eosinophils Relative: 2 % (ref 0–5)
Eosinophils Relative: 3 % (ref 0–5)
Eosinophils Relative: 3 % (ref 0–5)
Eosinophils Relative: 4 % (ref 0–5)
Lymphocytes Relative: 4 % — ABNORMAL LOW (ref 12–46)
Lymphocytes Relative: 5 % — ABNORMAL LOW (ref 12–46)
Lymphocytes Relative: 6 % — ABNORMAL LOW (ref 12–46)
Lymphocytes Relative: 6 % — ABNORMAL LOW (ref 12–46)
Lymphocytes Relative: 7 % — ABNORMAL LOW (ref 12–46)
Lymphs Abs: 0.7 10*3/uL (ref 0.7–4.0)
Lymphs Abs: 0.7 10*3/uL (ref 0.7–4.0)
Lymphs Abs: 1 10*3/uL (ref 0.7–4.0)
Lymphs Abs: 1.1 10*3/uL (ref 0.7–4.0)
Lymphs Abs: 1.2 10*3/uL (ref 0.7–4.0)
Lymphs Abs: 1.5 10*3/uL (ref 0.7–4.0)
Lymphs Abs: 1.6 10*3/uL (ref 0.7–4.0)
Monocytes Absolute: 0.8 10*3/uL (ref 0.1–1.0)
Monocytes Absolute: 0.9 10*3/uL (ref 0.1–1.0)
Monocytes Absolute: 0.9 10*3/uL (ref 0.1–1.0)
Monocytes Absolute: 0.9 10*3/uL (ref 0.1–1.0)
Monocytes Absolute: 1.3 10*3/uL — ABNORMAL HIGH (ref 0.1–1.0)
Monocytes Relative: 3 % (ref 3–12)
Monocytes Relative: 5 % (ref 3–12)
Monocytes Relative: 5 % (ref 3–12)
Monocytes Relative: 5 % (ref 3–12)
Monocytes Relative: 6 % (ref 3–12)
Monocytes Relative: 6 % (ref 3–12)
Monocytes Relative: 6 % (ref 3–12)
Neutro Abs: 16 10*3/uL — ABNORMAL HIGH (ref 1.7–7.7)
Neutro Abs: 16.7 10*3/uL — ABNORMAL HIGH (ref 1.7–7.7)
Neutro Abs: 16.9 10*3/uL — ABNORMAL HIGH (ref 1.7–7.7)
Neutro Abs: 19.1 10*3/uL — ABNORMAL HIGH (ref 1.7–7.7)
Neutro Abs: 19.7 10*3/uL — ABNORMAL HIGH (ref 1.7–7.7)
Neutro Abs: 19.8 10*3/uL — ABNORMAL HIGH (ref 1.7–7.7)
Neutro Abs: 20.1 10*3/uL — ABNORMAL HIGH (ref 1.7–7.7)
Neutrophils Relative %: 86 % — ABNORMAL HIGH (ref 43–77)
Neutrophils Relative %: 87 % — ABNORMAL HIGH (ref 43–77)
Neutrophils Relative %: 87 % — ABNORMAL HIGH (ref 43–77)
Neutrophils Relative %: 88 % — ABNORMAL HIGH (ref 43–77)
Neutrophils Relative %: 89 % — ABNORMAL HIGH (ref 43–77)
Neutrophils Relative %: 89 % — ABNORMAL HIGH (ref 43–77)
Neutrophils Relative %: 89 % — ABNORMAL HIGH (ref 43–77)
Neutrophils Relative %: 89 % — ABNORMAL HIGH (ref 43–77)

## 2010-09-16 LAB — CROSSMATCH

## 2010-09-16 LAB — URINE CULTURE

## 2010-09-16 LAB — LIPID PANEL
Cholesterol: 147 mg/dL (ref 0–200)
HDL: 24 mg/dL — ABNORMAL LOW (ref >39)
LDL Cholesterol: 86 mg/dL (ref 0–99)
Total CHOL/HDL Ratio: 6.1 RATIO
Total CHOL/HDL Ratio: 7.6 RATIO
Triglycerides: 184 mg/dL — ABNORMAL HIGH (ref <150)
VLDL: 37 mg/dL (ref 0–40)
VLDL: 58 mg/dL — ABNORMAL HIGH (ref 0–40)

## 2010-09-16 LAB — CK TOTAL AND CKMB (NOT AT ARMC)
CK, MB: 1.1 ng/mL (ref 0.3–4.0)
Relative Index: INVALID (ref 0.0–2.5)
Total CK: 31 U/L (ref 7–177)

## 2010-09-16 LAB — URINALYSIS, ROUTINE W REFLEX MICROSCOPIC
Bilirubin Urine: NEGATIVE
Nitrite: NEGATIVE
Specific Gravity, Urine: 1.02 (ref 1.005–1.030)
Urobilinogen, UA: 0.2 mg/dL (ref 0.0–1.0)

## 2010-09-16 LAB — TROPONIN I
Troponin I: 0.04 ng/mL (ref 0.00–0.06)
Troponin I: 0.35 ng/mL — ABNORMAL HIGH (ref 0.00–0.06)

## 2010-09-16 LAB — SEDIMENTATION RATE: Sed Rate: 50 mm/hr — ABNORMAL HIGH (ref 0–22)

## 2010-09-16 LAB — CK: Total CK: 119 U/L (ref 7–177)

## 2010-09-16 LAB — LACTATE DEHYDROGENASE: LDH: 174 U/L (ref 94–250)

## 2010-09-16 LAB — PREPARE RBC (CROSSMATCH)

## 2010-09-16 LAB — TSH: TSH: 1.335 u[IU]/mL (ref 0.350–4.500)

## 2010-09-16 LAB — CULTURE, BLOOD (ROUTINE X 2)
Culture: NO GROWTH
Report Status: 5152011
Report Status: 5152011

## 2010-09-16 LAB — CULTURE, BLOOD (SINGLE)

## 2010-09-16 LAB — PROCALCITONIN: Procalcitonin: 1.44 ng/mL

## 2010-09-16 LAB — C-REACTIVE PROTEIN: CRP: 1.2 mg/dL — ABNORMAL HIGH (ref <0.6)

## 2010-11-11 NOTE — H&P (Signed)
NAMETREVIA, NOP           ACCOUNT NO.:  [id number]   MEDICAL RECORD NO.:  [id number]          PATIENT TYPE:  INP   LOCATION:  A301                          FACILITY:  [facility]   PHYSICIAN:  Tesfaye D. Felecia Shelling, MD   DATE OF BIRTH:  August 19, 1928   DATE OF ADMISSION:  03/13/2007  DATE OF DISCHARGE:  LH                              HISTORY & PHYSICAL   CHIEF COMPLAINT:  Pain and has swelling of the lower extremities.   HISTORY OF PRESENT ILLNESS:  This is 77 year old black female with a  history of multiple medical illnesses including morbid obesity, who came  to emergency room with the above complaints.  The patient has been  complaining of lower extremity pain for several months.  She has been  previously evaluated.  She had ultrasound of the lower extremities and  the patient has been receiving pain medication.  However, in the last  few days started having redness and worsening pain in her legs.  Her  legs have been chronically swollen.  She was brought to emergency room,  where she was evaluated and the patient was admitted as a possible case  of cellulitis.   REVIEW OF SYSTEMS:  The patient has no fever, chills, cough, nausea and  vomiting, abdominal pain, dysuria, urgency or frequency of urination.   PAST MEDICAL HISTORY:  1. Morbid obesity.  2. Diabetes mellitus.  3. Lower extremity chronic swelling.  4. Anemia of chronic disease.  5. Hypertension.  6. Degenerative joint disease.  7. History of gastroenteritis.  8. Bronchitis.   CURRENT MEDICATIONS:  1. Gabapentin 1200 mg p.o. t.i.d.  2. Glucophage 1000 mg b.i.d.  3. Vicodin 10/500 mg one tablet p.o. q.6h. p.r.n.  4. Lotrel 10/20 mg one tablet p.o. daily.  5. Lasix 40 mg p.o. t.i.d.  6. Nexium 40 mg daily.  7. Glipizide 5 mg p.o. daily.  8. Dicyclomine 10 mg p.o. t.i.d.   SOCIAL HISTORY:  The patient lives at home.  She has no history of  alcohol, tobacco or substance abuse.   PHYSICAL EXAMINATION:  The patient  is alert, awake and morbidly obese.  VITAL SIGNS:  Blood pressure 124/76, pulse 82, respiratory rate 20,  temperature 98.7 degrees Fahrenheit.  HEENT: Pupils are equal and reactive.  NECK:  Supple.  CHEST:  Decreased air entry, few rhonchi.  CARDIOVASCULAR:  First and second heart sound heard.  No murmur, no  gallop.  ABDOMEN:  Soft and lax.  Bowel sounds positive.  No mass or  organomegaly.  The patient has a morbid obesity.  EXTREMITIES:  There is bilateral redness of her legs with worsening  edema.  The patient has tenderness more on the left side.   LABS ON ADMISSION:  BMP:  Sodium 142, potassium 4.1, chloride 114,  carbon dioxide 24, glucose 179, BUN 11, creatinine 1.7 and calcium 7.9.   ASSESSMENT:  1. Lower extremity bilateral stasis dermatitis, to rule out      cellulitis.  2. Chronic peripheral lower extremity swelling  3. Morbid obesity.  4. Diabetes mellitus.  5. Osteoarthritis.  6. Chronic abdominal pain.   PLAN:  Will continue the patient on IV antibiotics.  Will do venous  Doppler of the lower extremity.  Will continue her regular medications.  Will do ultrasound of the lower extremities.      Tesfaye D. Felecia Shelling, MD  Electronically Signed     TDF/MEDQ  D:  03/14/2007  T:  03/15/2007  Job:  [id number]

## 2010-11-11 NOTE — Assessment & Plan Note (Signed)
NAMEMarland Kitchen  Brandi, [name]            CHART#:  [id number]   DATE:  01/06/2007                       DOB:  22-Jun-1929   REFERRING PHYSICIAN:  Tesfaye D. Brandi [name], M.D.   REASON FOR CONSULTATION:  Abdominal pain.   HISTORY OF PRESENT ILLNESS:  [name] is a pleasant 78-year-  old massively morbidly obese African American female sent over courtesy  Dr. Felecia [name] for further evaluation of abdominal pain.  She states she has  had abdominal pain since 2005.  She states it began insidiously and has  been persistent over the past good 2.5 years.  She does not recall any  inciting event precipitating her abdominal pain.  She has it day in and  day out.  She wakes up with it and she goes to bed with it.  She states  eating a meal ameliorates her abdominal pain.  She describes a diffuse  abdominal pain pointing to her large panniculus.  She denies  constipation.  She has 1-3 bowel movements daily, has not had any melena  or rectal bleeding.  She denies diarrhea.  She denies recent weight  loss.   She has been seen by Dr. Karilyn [name] previously for anemia and transient  nausea, vomiting previously.  It appears from the record she was last  seen on August 13, 2004, at which time she underwent an EGD and  colonoscopy by Dr. Karilyn [name] at [facility].  She was found to have  a small hiatal hernia.  She had no peptic ulcer or gastric carcinoma.  Colonoscopy at that time revealed pancolonic diverticula and a small  polyp which was ablated in the transverse colon.  Path on that turned  out to be a benign fragment of edematous mucosa.  She also had contrast  CT of the chest, abdomen, and pelvis which demonstrated some small vague  nodularities in the right lung.  She also had a questionable non-  obstructive proximal left renal calculus and a small right renal  calculus.  She is status post cholecystectomy.  She was noted to have a  tiny umbilical hernia, otherwise scans of the abdomen and pelvis  were  unremarkable.   She has since seen Dr. Mariel [name] for a mixed iron-deficiency anemia of  chronic disease.  She does have a Port-A-Cath and has received at least  4 rounds of parenteral iron therapy.  She tells me she has not had any  recent labs.  She has had some heartburn symptoms previously but she  takes Nexium 40 mg orally daily and really has not had any problems  recently.  She does not take any nonsteroidals.  She denies odynophagia,  dysphagia, or early satiety.  She has not had any flank pain, dysuria,  increased urinary frequency.  There is no family history of colorectal,  GI neoplasia, although her mother died of some unknown cancer long ago.   PAST MEDICAL HISTORY:  1. Hypertension.  2. Diabetes mellitus for over 20 years.  3. Remote history of peptic ulcer disease while living in      .  4. Hiatal hernia.  5. Periumbilical hernia on CT.  6. Degenerative joint disease.  7. Peripheral edema.  8. Mild aortic stenosis on an echocardiogram previously.  9. Morbid obesity.   PAST SURGERIES:  1. Cholecystectomy.  2. Tubal ligation.  3. EGD and  colonoscopy as described above.   CURRENT MEDICATIONS:  1. Nasonex 50 mcg p.r.n.  2. Hydrocodone/APAP 1 tablet q.8 h. p.r.n.  3. Multivitamin daily.  4. Furosemide 40 mg t.i.d.  5. Nexium 40 mg orally daily.  6. Glucophage 1 gram b.i.d.  7. Glipizide ER 5 mg daily.  8. Amlodipine/benazepril 10/20, once daily.   ALLERGIES:  No known drug allergies.   FAMILY HISTORY:  Mother died of cancer, primary unknown.  Daughter had  breast cancer.  No history of GI malignancy documented.   SOCIAL HISTORY:  The patient is widowed.  She has multiple family  members who live here and in [place].  She has one daughter here.  No tobacco or alcohol use.  Friends and family help her around.  She has  to get a ride with [employer].   REVIEW OF SYSTEMS:  As in history of present illness.  She is basically   wheelchair bound because of her morbid obesity and inability to  ambulate, has not really had any chest pain or dyspnea, no fever,  chills, no apparent change in weight, but she really has not been  weighed lately, no clay-colored stools, dark-colored urine, no flank  pain, no fever, chills.   PHYSICAL EXAMINATION:  GENERAL:  This lady's exam is limited because she  is bound to the wheelchair.  In fact she is tied in the wheelchair with  a rope and cannot stand and would not be able to get up on our examining  table today.  VITAL SIGNS:  Height 5 feet 4 inches, temp 98.3, BP 140/78, pulse 72.  HEENT:  No scleral icterus.  Conjunctivae are pink.  Oral cavity, no  lesions.  CHEST:  Lungs are clear to auscultation.  CARDIAC:  Regular, rate and rhythm without murmur, gallop or rub.  BREASTS:  Deferred.  ABDOMEN:  Massively obese with a right upper quadrant keloid scar  present.  Bowel sounds are present.  Abdomen is soft but exam is  markedly limited by her body habitus and the fact that she is sitting in  a wheelchair.  EXTREMITIES:  She has brawny lower extremity edema.  RECTAL:  Not feasible.   IMPRESSION:  [name] is a very pleasant 77 year old lady  with quite vague, nonspecific, chronic abdominal pain.  This is in the  setting of a mixed iron-deficiency/anemia of chronic disease picture.   By history she has received parenteral iron previously.  Etiology of her  abdominal pain is not clear to me at this time.  It is good to know she  had an EGD and a colonoscopy not too long ago without significant  findings.  And there is really nothing to suggest urinary tract  pathology.  She did have some nonspecific nodules in her lung on prior  chest CT.   RECOMMENDATIONS:  1. I have asked Brandi [name] to please try and return 3 hemoccult      cards.  2. We will go ahead and do some currently labs including a CBC and a      chem-20.  3. We will make every effort to  attempt a contrast CT of the chest,      abdomen, and pelvis in the very near future to see if we can get an      idea of what may be contributing to this lady's abdominal pain.      Once the above mentioned data is available for review, I will  render further recommendations.   I would like to thank Dr. Felecia [name] for allowing me to see this nice lady  today.       Jonathon Bellows, M.D.  Electronically Signed     RMR/MEDQ  D:  01/06/2007  T:  01/06/2007  Job:  [id number]   cc:   Tesfaye D. Brandi Shelling, MD  Ladona Horns. Brandi Sleet, MD

## 2010-11-11 NOTE — Assessment & Plan Note (Signed)
NAMEMarland Kitchen  Brandi [name], Brandi [name]            CHART#:  [id number]   DATE:  02/25/2007                       DOB:  1928-11-10   CHIEF COMPLAINT:  Follow up abdominal pain/anemia.   SUBJECTIVE:  Brandi [name] is a 77 year old African-American female who  is seen by Dr. Jena Gauss on 01/06/2007.  Please see his extensive note.  She  has vague, nonspecific chronic abdominal pain in the setting of mixed  iron deficiency anemia and anemia chronic disease.  She has previously  had to receive parenteral iron.  She had an EGD and colonoscopy by Dr.  Dionicia Abler August 13, 2004, which showed edematous mucosa ablated from a  small polyp in the transverse colon and was otherwise unremarkable  except for a small hiatal hernia.  She had a recent CT of the abdomen,  pelvis and chest.  She was found to have diverticulosis, numerous lung  nodules bilaterally, the largest being 6 mm which appears to be stable  from the 08/13/2004 study and felt to be post inflammatory and benign.  She tells me her blood sugars have been normal usually in the low 100  range.  She has been having to take two to three Imodium a day for  diarrhea, she is having upwards of three stools per day.  She described  the abdominal pain as an aching.  It is always worsened with eating.  She has had a cholecystectomy in 1982.  She denies any vomiting.  She  denies any fever or chills.  She has been coughing up clear mucus.  She  is on Nexium 400 mg daily to control her heartburn, and indigestion.   LABORATORY:  Labs from 01/12/2007 reveal a hemoglobin of 10.4,  hematocrit of 33.4, MCV of 93.8.  She has a creatinine of 1.53 and a BUN  of 44.  Dr. Jena Gauss had asked her to decrease her Lasix to 40 mg daily and  check edema in two weeks however, she has not done this.  She is  confused about ever receiving this message.  Three hemoccult from  01/13/2007 were negative.   CURRENT MEDICATIONS:  Current medications, see the list from 02/25/2007.   ALLERGIES:   No known drug allergies.   OBJECTIVE:  VITAL SIGNS:  Weight unable to obtain.  Temperature 96,  blood pressure 122/64, pulse 72.  GENERAL:  Brandi [name] is a morbidly obese African-American female who  is wheelchair bound.  She is alert, oriented, pleasant and cooperative  in no acute distress.  She is accompanied by her friend today.  HEENT:  The pupils are equal, round and reactive to light and  accommodation.  Oropharynx pink and moist without any lesions.  CHEST:  Heart regular rhythm, she has a 3/6 murmur noted.  ABDOMEN:  Protuberant, morbidly obese with positive bowel sounds times  four.  No bruits auscultated.  Abdomen is soft, nontender without  positive mass or hepatosplenomegaly, although exam is significantly  limited given the patient's body habitus.  EXTREMITIES:  2+ pitting lower extremity edema bilaterally.   ASSESSMENT:  Brandi [name] is a 77 year old morbidly obese  female with intermittent chronic abdominal pain and several loose stools  per day.  Colonoscopy from 2006 without significant findings.  Recent CT  without significant findings.  She has not yet had her small bowel  imaged directly but there is no  evidence of hemoccult positive stool.  Her anemia is a picture of chronic disease and not sure that this is an  iron deficiency picture.  Her creatinine continues to rise and she is on  diuretics and this needs to be checked as well.  Ischemia should remain  in differential as well.  There is no comment on her mesentery on the  last CT scan.   PLAN:  1. Check Med7.  2. Decrease Lasix to 40 mg daily until we can review Med7.  3. Begin Benefiber daily.  4. Bentyl 10 mg AC up to t.i.d. #60 with no refills.  5. We will talk to Radiology about reviewing CT films to look at her      mesentery.  6. Hemoccult stools times three, if positive she is going to need      further evaluation with Givens capsule study.       Lorenza Burton, N.P.   Electronically Signed     R. Roetta Sessions, M.D.  Electronically Signed    KJ/MEDQ  D:  02/25/2007  T:  02/26/2007  Job:  [id number]   cc:   Ninetta Lights D. Felecia Shelling, MD

## 2010-11-11 NOTE — Discharge Summary (Signed)
[name], Brandi [name]           ACCOUNT NO.:  [id number]   MEDICAL RECORD NO.:  [id number]          PATIENT TYPE:  INP   LOCATION:  A338                          FACILITY:  [facility]   PHYSICIAN:  Tesfaye D. Felecia Shelling, MD   DATE OF BIRTH:  05-Feb-1929   DATE OF ADMISSION:  03/13/2007  DATE OF DISCHARGE:  09/19/2008LH                               DISCHARGE SUMMARY   DISCHARGE DIAGNOSES:  1. Cellulitis of the lower extremities.  2. Stasis dermatitis of the lower extremities.  3. Morbid obesity.  4. Diabetes mellitus.  5. Anemia of chronic disease.  6. Hypertension.  7. Degenerative joint disease.  8. Severe deconditioning.   DISCHARGE MEDICATIONS:  1. Aspirin 81 mg daily.  2. Lotensin 20 mg daily.  3. Norvasc 10 mg daily.  4. Lasix 40 mg b.i.d.  5. Protonix 40 mg daily.  6. Glucotrol XL 5 mg daily.  7. Gabapentin 600 mg b.i.d.  8. MiraLax 17 g daily as needed.  9. Senna 2 tablets daily as needed.  10.Lortab 10/500, 1 tablet q.i.d.  11.Keflex 500 mg p.o. q.i.d. for 7 more days.   DISPOSITION:  The patient will be discharged to a nursing home.   HOSPITAL COURSE:  This is a 78 year old black female with a history of  multiple medical illnesses including morbid obesity, who was admitted  due to cellulitis and stasis dermatitis of the lower extremity.  The  patient was started on IV antibiotics.  She had also a significant drop  in hemoglobin and hematocrit.  The patient received 2 units of packed  red blood cells and was evaluated by hematologist.  The patient was  found to have anemia of chronic disease.  Over the hospital stay, the  patient's symptoms improved, and her cellulitis improved.  However, the  patient  remained severely deconditioned and unable to get out of bed.  Arrangement is currently being made for a skilled nursing facility.  The  patient will be discharged to nursing facility in stable condition, to  continue physical therapy and the current treatment.      Tesfaye D. Felecia Shelling, MD  Electronically Signed     TDF/MEDQ  D:  03/18/2007  T:  03/18/2007  Job:  [id number]

## 2010-11-14 NOTE — Consult Note (Signed)
NAMEALFREDO, COLLYMORE           ACCOUNT NO.:  [id number]   MEDICAL RECORD NO.:  [id number]          PATIENT TYPE:  AMB   LOCATION:  DAY                           FACILITY:  [facility]   PHYSICIAN:  Lionel December, M.D.    DATE OF BIRTH:  [birth year]   DATE OF CONSULTATION:  07/30/2004  DATE OF DISCHARGE:                                   CONSULTATION   REFERRING PHYSICIAN:  Tesfaye D. Felecia Shelling, M.D.   CHIEF COMPLAINT:  Abdominal pain, diarrhea, nausea and vomiting.   HISTORY OF PRESENT ILLNESS:  The patient is a 77 year old, morbidly, black  female who presents today for further evaluation of abdominal pain, nausea,  vomiting and diarrhea.  She states she has had chronic intermittent  abdominal pain and diarrhea for a long time.  She is unsure how long this  has been going on.  However, on June 20, 2004, she developed nausea and  vomiting, as well as worsening of her abdominal pain and diarrhea.  The  symptoms persisted for about a week and she eventually was admitted to the  hospital for about four days.  It was felt that she had acute  gastroenteritis.  During that hospital stay, she had abdominal films which  revealed possibly mild ileus present, but no overt bowel obstruction.  She  had stool culture and O&P which were negative.  Laboratories also revealed a  hemoglobin of 8.5, hematocrit 26, platelets 214,000, WBC 6.3, iron 30, TIBC  363, iron saturations 8%, ferritin 25 and normal B12 and folate.  She has  since had an abdominal ultrasound which was limited due to body habitus,  however, there were no abnormalities noted.  She is status post  cholecystectomy.  For the last month, she has had postprandial nausea and  vomiting, especially if she eats solid foods.  She intermittently can handle  liquids.  She denies any heartburn symptoms.  She states it is well  controlled on Nexium.  With regards to her abdominal pain, it is quite  diffuse in nature.  Sometimes she has sharp pains  in both flank areas and up  underneath both breasts.  She states that her stomach hurts if she does not  eat.  Sometimes it helps to eat, but then eventually the pain comes back.  Her bowels are moving on a daily basis.  She describes loose mucus and  nonbloody stools.  She is unsure of how many stools she has a day, but  states it is often immediately after eating.  Denies any constipation or  ever skipping a day without a bowel movement.  Denies any melena or rectal  bleeding, hematemesis, dysphagia or odynophagia.  She reports a remote  colonoscopy and EGD.  She states she had previously had peptic ulcer disease  and has a hiatal hernia.   Review of E-chart reveals a CT of the abdomen and pelvis with stone protocol  ordered by Dr. Jerre Simon on February 28, 2004.  She was found to have a 9 mm  size lower pole right renal calculus.  There was mild dilatation in the  right pelvic calyceal system  to the level of the right ureteropelvic  junction which was stable.  There was bilateral probable ovarian vein  phlebolith.  She had small periumbilical hernia containing fat and a small  hiatal hernia.  There were multiple tiny noncalcified bilateral lower lobe  parenchymal pulmonary nodules.  A full chest CT was recommended, but has not  yet been done.   CURRENT MEDICATIONS:  1.  Nasarel p.r.n.  2.  Hydrocodone/APAP 10/500 mg q.8h. p.r.n.  3.  Multivitamins daily.  4.  Furosemide 40 mg t.i.d.  5.  Klor-Con 20 mEq t.i.d.  6.  Lotrel 10/20 mg daily.  7.  Tramadol 50 mg t.i.d.  8.  Nexium 40 mg daily.  9.  Glucophage 1000 mg b.i.d.  10. Caltrate 600 plus D daily.  11. Imodium p.r.n.  12. Tylenol p.r.n.  13. Actos 45 mg daily.  14. She is supposed to be on iron, however, she states it caused diarrhea,      therefore she stopped it.   ALLERGIES:  No known drug allergies.   PAST MEDICAL HISTORY:  1.  Hypertension.  2.  Diabetes mellitus for over 20 years.  3.  History of remote peptic  ulcer disease when she was living in      [place].  4.  Hiatal hernia.  5.  Small periumbilical hernia noted on CT.  6.  Degenerative joint disease.  7.  Peripheral edema.  8.  Mild aortic stenosis based on echocardiogram.   PAST SURGICAL HISTORY:  1.  Cholecystectomy.  2.  Tubal ligation.  3.  She has a fibroid tumor removed and believes that she has had      hysterectomy.   FAMILY HISTORY:  Mother died of metastatic cancer, primary unknown.  A  daughter has had breast cancer, but is doing well.   SOCIAL HISTORY:  She is widowed and has five living children, one deceased.  She is retired.  She recently moved back to [place], [place], from  [place] three years ago.  She has a daughter here.  She has never been  a smoker.  Denies any alcohol use.   REVIEW OF SYSTEMS:  GASTROINTESTINAL:  See HPI.  CARDIOPULMONARY:  Denies  any chest pain or shortness of breath.  MUSCULOSKELETAL:  She has very  limited mobility due to size and degenerative joint disease.  She is able to  transfer from her wheelchair to the bed and to a bedside commode.  She is  living at home.   PHYSICAL EXAMINATION:  WEIGHT:  Weighs 340 pounds per the patient when she  was in the hospital recently.  HEIGHT:  5 feet 5 inches.  VITAL SIGNS:  Temperature 98.5 degrees, blood pressure 126/70, pulse 84.  GENERAL APPEARANCE:  A pleasant, morbidly obese, black female in no acute  distress.  SKIN:  Warm and dry.  No jaundice.  HEENT:  Pupils equal, round and reactive to light.  The conjunctivae are  pale.  The sclerae are nonicteric.  The oropharyngeal mucosa is moist and  pink.  No lesions, erythema or exudate.  NECK:  No lymphadenopathy or thyromegaly.  CHEST:  Lungs clear to auscultation.  CARDIAC:  Regular rate and rhythm.  A 2/6 systolic ejection murmur heard  best in the upper precordium. ABDOMEN:  Positive bowel sounds.  Obese, but symmetrical.  Examined while in  wheelchair.  She states  that she is unable to get on the examination table.  She has diffuse mild tenderness throughout her abdomen.  Could not  appreciate for organomegaly or masses due to body habitus and examination  position.  Did not appreciate any abdominal bruits.  EXTREMITIES:  2+ pitting edema bilaterally.   IMPRESSION:  Ms. Scheel is a 77 year old lady with multiple GI issues.  She complains of chronic intermittent diffuse abdominal pain and diarrhea.  More recently she developed nausea and vomiting postprandially the last few  weeks.  She has not been able to eat solid food due to persistent vomiting.  She is only able to keep down liquids.  She has been a diabetic over 20  years and may have gastroparesis.  She also has a remote history of peptic  ulcer disease and cannot rule out peptic ulcer disease with partial gastric  outlet obstruction.  Her typical GERD symptoms are well controlled on  Nexium.  She is also noted to have significant anemia with low normal iron  and ferritin with low iron saturations.  It may be simply due to anemia of  chronic disease, but cannot rule out trend toward iron deficiency anemia.  Given the above signs and symptoms, I recommend EGD and colonoscopy for  further evaluation.  I have discussed the risks, alternatives and benefits  and she is agreeable to proceed.   She also was noted to have multiple tiny noncalcified pulmonary nodules on  the CT done back in September of 2005.  The patient is unaware of having any  followup study.   PLAN:  1.  EGD and colonoscopy in the near future.  Will provide SBE prophylaxis      given a history of mild aortic stenosis and heart murmur.  2.  Reevaluate anemia and hypokalemia with CBC and MET-7 today.  3.  Consider full chest CT to further evaluate pulmonary nodules as      previously recommended by radiologist.  Will address further with Dr.      Karilyn Cota.      LL/MEDQ  D:  07/30/2004  T:  07/30/2004  Job:  [id number]   cc:    Tesfaye D. Felecia Shelling, MD  [address]  [place]  [place] [address]  Fax: [phone]

## 2010-11-14 NOTE — Procedures (Signed)
NAME:  Brandi [name], Brandi [name] NO.:  [id number]   MEDICAL RECORD NO.:  [id number]                  PATIENT TYPE:  PREC   LOCATION:                                       FACILITY:   PHYSICIAN:  Dani Gobble, MD                    DATE OF BIRTH:   DATE OF PROCEDURE:  12/25/2003  DATE OF DISCHARGE:                                  ECHOCARDIOGRAM   REFERRING PHYSICIAN:  Tesfaye D. Felecia Shelling, M.D. and Nicki Guadalajara, M.D.   INDICATION:  Ms.  Brimley is a 77 year old female with a past medical  history of diabetes and hypertension, who was found to have a murmur, who  has been experiencing chest pain and shortness of breath.   The aorta is within normal limits at 3.5 cm.  The left atrium is mildly  dilated at 4.3 cm.  There were no obvious clots or masses noted, and the  patient appeared to be in sinus rhythm during this procedure.   1. The interventricular septum and posterior wall were both mildly thickened     at 1.4 c, and 1.5 cm, respectively.  2. The aortic valve appeared mildly thickened in all its leaflet with     minimal decrease in leaflet excursion.  Mild aortic insufficiency is     appreciated.  Doppler interrogation of aortic valve revealed a peak     velocity of 2.4 meters per second, corresponding to a peak gradient of 24     mmHg and a mean gradient of 16 mmHg.  3. The mitral valve also appeared mildly thickened but with normal leaflet     excursion.  No mitral valve prolapse is noted.  Mild mitral annular     calcification was noted.  Doppler interrogation of the mitral valve was     within normal limits.  4. The pulmonic valve was not well visualized.  5. The tricuspid valve also was not well visualized but appeared to be     grossly structurally normal.  6. The left ventricle was normal in size.  The LV IDD measured 4.3 cm.  The     LV IDD measured 3.0 cm.  Overall left ventricular systolic function was     normal, and no regional wall motion  abnormalities were noted.  The     presence of diastolic dysfunction was inferred from pulsewave Doppler     across the mitral valve.  The right atrium appeared normal in size:  The     right ventricle was not well visualized but is probably normal in size     with normal right ventricular systolic function.  There was an anterior     echo-free space which is most consistent with fat pad.   IMPRESSION:  1. Mild left atrial enlargement.  2. Mild concentric left ventricular hypertrophy.  3. Very mild aortic stenosis with continued reasonable leaflet excursion.  4. Normal left  ventricular size and systolic function without regional wall     motion abnormalities noted.  5. Presence of diastolic dysfunction is inferred from pulsewave Doppler     across the mitral valve.  6. There is an anterior echo-free space most consistent with fat pad.  7. Mild aortic insufficiency.      ___________________________________________                                            Dani Gobble, MD   AB/MEDQ  D:  12/25/2003  T:  12/25/2003  Job:  [id number]   cc:   Tesfaye D. Felecia Shelling, M.D.  [address]  [place]  [place] [address]  Fax: [phone]   Nicki Guadalajara, M.D.  [phone] [address]. [address], [address]  [place], [place] [address]  Fax: [phone]

## 2010-11-14 NOTE — Op Note (Signed)
[name], [name]           ACCOUNT NO.:  [id number]   MEDICAL RECORD NO.:  [id number]          PATIENT TYPE:  AMB   LOCATION:  DAY                           FACILITY:  [facility]   PHYSICIAN:  Jerolyn Shin C. Katrinka Blazing, M.D.   DATE OF BIRTH:  Jan 18, 1929   DATE OF PROCEDURE:  07/21/2005  DATE OF DISCHARGE:                                 OPERATIVE REPORT   PREOPERATIVE DIAGNOSIS:  Refractory and iron-deficiency anemia.   POSTOPERATIVE DIAGNOSIS:  Refractory iron-deficiency anemia.   PROCEDURE:  Left subclavian Port-A-Cath insertion.   SURGEON:  Dirk Dress. Katrinka Blazing, M.D.   DESCRIPTION:  The patient was taken to the OR suite.  The left neck and  chest were prepped and draped in a sterile field.  Local infiltration was  carried out with 1% Xylocaine.  The patient was placed in the Trendelenburg  position.  The left subclavian vein was accessed with a single stick. The  guidewire was placed under fluoroscopic guidance.   The catheter was tunneled to the port site.  A pocket was developed. The  catheter was then positioned in the superior vena cava under fluoroscopic  guidance.  The catheter was attached to the chamber without difficulty.  The  system was flushed with heparinized saline.  The position was confirmed by  fluoroscopy.  The chamber was attached to the fascia with 3-0 Prolene.  The  pocket was closed with 3-0 Monocryl and 4-0 Dexon.  Dressing was placed.  A  final flush with 4000 units of heparin was carried out.  The patient  tolerated the procedure well.  She was transferred to the postanesthesia  care unit for followup chest x-ray.      Dirk Dress. Katrinka Blazing, M.D.  Electronically Signed     LCS/MEDQ  D:  07/21/2005  T:  07/21/2005  Job:  [id number]

## 2010-11-14 NOTE — Group Therapy Note (Signed)
NAMEDONNETTA, GILLIN NO.:  [id number]   MEDICAL RECORD NO.:  [id number]          PATIENT TYPE:  INP   LOCATION:  A211                          FACILITY:  [facility]   PHYSICIAN:  Edward L. Juanetta Gosling, M.D.DATE OF BIRTH:  [birth year]   DATE OF PROCEDURE:  DATE OF DISCHARGE:                                   PROGRESS NOTE   Mrs. Caskey says she feels better.  She has no new complaints this  morning.  She still has some abdominal discomfort which is mostly in the  right upper quadrant.  She has had a cholecystectomy.  She has no other new  complaints.   VITAL SIGNS:  Her exam shows a temperature of 98.4, pulse 67, respirations  20.  Blood sugar 127.  It has been as high as 151.  Her blood pressure  105/62, weight 341.  CHEST:  Clear.  ABDOMEN:  Fairly soft.  EXTREMITIES:  Showed no edema.  CNS:  Grossly intact.   ASSESSMENT:  She has probably gastroenteritis which appears to have  resolved.  She is anemic, which is actually somewhat improved from  admission.  She has massive obesity, which of course is unchanged.  She has  severe osteoarthritis of her legs which is worse because of her massive  obesity, and she has diabetes.   No changes in her treatment plans.  Tomorrow Dr. Felecia Shelling will resume her care  in the morning.     Edwa   ELH/MEDQ  D:  06/29/2004  T:  06/29/2004  Job:  [id number]

## 2010-11-14 NOTE — Procedures (Signed)
NAME:  Brandi [name], Brandi [name]                     ACCOUNT NO.:  [id number]   MEDICAL RECORD NO.:  [id number]                   PATIENT TYPE:  PREC   LOCATION:                                       FACILITY:   PHYSICIAN:  Dani Gobble, MD                    DATE OF BIRTH:   DATE OF PROCEDURE:  12/25/2003  DATE OF DISCHARGE:                                    STRESS TEST   REFERRING PHYSICIAN:  Tesfaye D. Felecia Shelling, M.D. and Nicki Guadalajara, M.D.   INDICATION:  Chest pain.   [name] is a 77 year old female with past medical history of diabetes  mellitus, hypertension, and chest discomfort, who was referred to evaluate  for the possibility of coronary ischemia as the etiology of her chest  discomfort.   ADENOSINE CARDIOLITE __________INFUSION STUDY:  The patient performed the  standard rest/pharmacology stress protocol   ELECTROCARDIOGRAPH/HEMODYNAMIC DATA:  Resting blood pressure was 148/70 with  a pulse of 92 beats per minute.  Baseline 12-lead EKG revealed normal sinus  rhythm without ischemic changes noted.  The patient experienced an odd  sensation in her head which resolved soon after discontinuation of the  adenosine infusion.  The 12-lead EKG during the procedure showed no  significant changes from peak heart rate of 107, and she has now recovered  down to the 90s.  Again, there were no ischemic changes noted.   OVERALL IMPRESSION:  1. Clinically negative for angina.  2. Electrocardiogram negative for ischemia.  3. Scintigraphic images are pending.      ___________________________________________                                            Dani Gobble, MD   AB/MEDQ  D:  12/25/2003  T:  12/25/2003  Job:  [id number]   cc:   Tesfaye D. Felecia Shelling, M.D.  [address]  [place]  [place] [address]  Fax: [phone]   Nicki Guadalajara, M.D.  [phone] N[address]  [place], [place] [address]  Fax: [phone]   [facility]

## 2011-04-06 LAB — BASIC METABOLIC PANEL
CO2: 27
Chloride: 100
Creatinine, Ser: 1.34 — ABNORMAL HIGH
GFR calc Af Amer: 47 — ABNORMAL LOW
Potassium: 4.2
Sodium: 136

## 2011-04-06 LAB — DIFFERENTIAL
Basophils Relative: 0
Eosinophils Relative: 23 — ABNORMAL HIGH
Monocytes Absolute: 0.7
Neutro Abs: 5.6
Neutrophils Relative %: 60

## 2011-04-06 LAB — CBC
Hemoglobin: 9.9 — ABNORMAL LOW
MCV: 88.2
RBC: 3.44 — ABNORMAL LOW
WBC: 9.4

## 2011-04-06 LAB — POCT CARDIAC MARKERS
CKMB, poc: 4.2
Myoglobin, poc: 94.6
Troponin i, poc: <0.05

## 2011-04-06 LAB — D-DIMER, QUANTITATIVE: D-Dimer, Quant: 8.63 — ABNORMAL HIGH

## 2011-04-06 LAB — B-NATRIURETIC PEPTIDE (CONVERTED LAB): Pro B Natriuretic peptide (BNP): 166 — ABNORMAL HIGH

## 2011-04-09 LAB — CBC
HCT: 28.3 — ABNORMAL LOW
MCHC: 32.4
MCHC: 32.8
MCV: 88.9
MCV: 89.4
Platelets: 224
RBC: 2.68 — ABNORMAL LOW
RBC: 2.79 — ABNORMAL LOW
RDW: 15.4 — ABNORMAL HIGH

## 2011-04-09 LAB — BASIC METABOLIC PANEL
BUN: 16
BUN: 25 — ABNORMAL HIGH
BUN: 30 — ABNORMAL HIGH
CO2: 23
CO2: 24
CO2: 24
CO2: 25
Chloride: 110
Chloride: 112
Chloride: 112
Chloride: 113 — ABNORMAL HIGH
Creatinine, Ser: 1.58 — ABNORMAL HIGH
Glucose, Bld: 107 — ABNORMAL HIGH
Glucose, Bld: 126 — ABNORMAL HIGH
Glucose, Bld: 131 — ABNORMAL HIGH
Glucose, Bld: 133 — ABNORMAL HIGH
Potassium: 4.5
Potassium: 4.5
Potassium: 6.3
Sodium: 143

## 2011-04-09 LAB — DIFFERENTIAL
Basophils Absolute: 0
Basophils Relative: 0
Basophils Relative: 0
Eosinophils Absolute: 0.5
Eosinophils Relative: 5
Lymphs Abs: 0.6 — ABNORMAL LOW
Monocytes Absolute: 1 — ABNORMAL HIGH
Monocytes Relative: 10
Myelocytes: 0
Neutro Abs: 6.7
Neutrophils Relative %: 78 — ABNORMAL HIGH
Neutrophils Relative %: 79 — ABNORMAL HIGH
Promyelocytes Absolute: 0

## 2011-04-09 LAB — CROSSMATCH: ABO/RH(D): A POS

## 2011-04-09 LAB — IRON AND TIBC
Iron: 15 — ABNORMAL LOW
TIBC: 242 — ABNORMAL LOW

## 2011-04-10 LAB — COMPREHENSIVE METABOLIC PANEL
ALT: 27
AST: 25
Alkaline Phosphatase: 73
CO2: 24
Calcium: 8.9
GFR calc Af Amer: 40 — ABNORMAL LOW
Glucose, Bld: 130 — ABNORMAL HIGH
Potassium: 6 — ABNORMAL HIGH
Sodium: 141
Total Protein: 6.4

## 2011-04-10 LAB — CBC
Hemoglobin: 8.6 — ABNORMAL LOW
MCHC: 33
MCHC: 33.2
MCV: 88.9
Platelets: 239
Platelets: 176
RBC: 2.9 — ABNORMAL LOW
RBC: 3.55 — ABNORMAL LOW
RDW: 15 — ABNORMAL HIGH
RDW: 15.4 — ABNORMAL HIGH
WBC: 10.5

## 2011-04-10 LAB — DIFFERENTIAL
Basophils Absolute: 0
Basophils Relative: 0
Basophils Relative: 0
Eosinophils Absolute: 0.3
Eosinophils Absolute: 0.9 — ABNORMAL HIGH
Eosinophils Absolute: 0.6
Eosinophils Relative: 3
Lymphocytes Relative: 9 — ABNORMAL LOW
Lymphs Abs: 0.6 — ABNORMAL LOW
Lymphs Abs: 0.9
Monocytes Relative: 8
Monocytes Relative: 6
Neutro Abs: 6.8
Neutro Abs: 8.3 — ABNORMAL HIGH
Neutrophils Relative %: 75
Neutrophils Relative %: 84 — ABNORMAL HIGH
Neutrophils Relative %: 79 — ABNORMAL HIGH

## 2011-04-10 LAB — BASIC METABOLIC PANEL
BUN: 31 — ABNORMAL HIGH
CO2: 24
Calcium: 8.7
Chloride: 109
Creatinine, Ser: 1.52 — ABNORMAL HIGH
GFR calc Af Amer: 40 — ABNORMAL LOW
Glucose, Bld: 159 — ABNORMAL HIGH

## 2011-04-10 LAB — CULTURE, BLOOD (ROUTINE X 2)
Culture: NO GROWTH
Report Status: 9192008

## 2011-04-10 LAB — FERRITIN: Ferritin: 141 (ref 10–291)

## 2011-09-25 ENCOUNTER — Emergency Department (HOSPITAL_COMMUNITY): Payer: Medicare Other

## 2011-09-25 ENCOUNTER — Encounter (HOSPITAL_COMMUNITY): Payer: Self-pay | Admitting: Emergency Medicine

## 2011-09-25 ENCOUNTER — Other Ambulatory Visit: Payer: Self-pay

## 2011-09-25 ENCOUNTER — Emergency Department (HOSPITAL_COMMUNITY)
Admission: EM | Admit: 2011-09-25 | Discharge: 2011-09-25 | Disposition: A | Discharge status: Home / Self Care | Payer: Medicare Other | Attending: Emergency Medicine | Admitting: Emergency Medicine

## 2011-09-25 DIAGNOSIS — K439 Ventral hernia without obstruction or gangrene: Secondary | ICD-10-CM | POA: Insufficient documentation

## 2011-09-25 DIAGNOSIS — R5381 Other malaise: Secondary | ICD-10-CM | POA: Insufficient documentation

## 2011-09-25 DIAGNOSIS — N200 Calculus of kidney: Secondary | ICD-10-CM | POA: Insufficient documentation

## 2011-09-25 DIAGNOSIS — R5383 Other fatigue: Secondary | ICD-10-CM | POA: Insufficient documentation

## 2011-09-25 DIAGNOSIS — Z9089 Acquired absence of other organs: Secondary | ICD-10-CM | POA: Insufficient documentation

## 2011-09-25 DIAGNOSIS — J449 Chronic obstructive pulmonary disease, unspecified: Secondary | ICD-10-CM | POA: Insufficient documentation

## 2011-09-25 DIAGNOSIS — E119 Type 2 diabetes mellitus without complications: Secondary | ICD-10-CM | POA: Insufficient documentation

## 2011-09-25 DIAGNOSIS — I10 Essential (primary) hypertension: Secondary | ICD-10-CM | POA: Insufficient documentation

## 2011-09-25 DIAGNOSIS — J4489 Other specified chronic obstructive pulmonary disease: Secondary | ICD-10-CM | POA: Insufficient documentation

## 2011-09-25 DIAGNOSIS — R109 Unspecified abdominal pain: Secondary | ICD-10-CM | POA: Insufficient documentation

## 2011-09-25 DIAGNOSIS — K5730 Diverticulosis of large intestine without perforation or abscess without bleeding: Secondary | ICD-10-CM | POA: Insufficient documentation

## 2011-09-25 DIAGNOSIS — Z90710 Acquired absence of both cervix and uterus: Secondary | ICD-10-CM | POA: Insufficient documentation

## 2011-09-25 HISTORY — DX: Essential (primary) hypertension: I10

## 2011-09-25 HISTORY — DX: Chronic obstructive pulmonary disease, unspecified: J44.9

## 2011-09-25 HISTORY — DX: Heart failure, unspecified: I50.9

## 2011-09-25 LAB — CBC
HCT: 36.1 % (ref 36.0–46.0)
Hemoglobin: 10.8 g/dL — ABNORMAL LOW (ref 12.0–15.0)
RBC: 3.85 MIL/uL — ABNORMAL LOW (ref 3.87–5.11)
RDW: 13.8 % (ref 11.5–15.5)
WBC: 9.3 10*3/uL (ref 4.0–10.5)

## 2011-09-25 LAB — DIFFERENTIAL
Basophils Absolute: 0 10*3/uL (ref 0.0–0.1)
Lymphocytes Relative: 2 % — ABNORMAL LOW (ref 12–46)
Lymphs Abs: 0.2 10*3/uL — ABNORMAL LOW (ref 0.7–4.0)
Monocytes Absolute: 0.4 10*3/uL (ref 0.1–1.0)
Neutro Abs: 8.6 10*3/uL — ABNORMAL HIGH (ref 1.7–7.7)

## 2011-09-25 LAB — URINALYSIS, ROUTINE W REFLEX MICROSCOPIC
Bilirubin Urine: NEGATIVE
Specific Gravity, Urine: 1.013 (ref 1.005–1.030)
pH: 6.5 (ref 5.0–8.0)

## 2011-09-25 LAB — COMPREHENSIVE METABOLIC PANEL
ALT: 13 U/L (ref 0–35)
AST: 30 U/L (ref 0–37)
CO2: 27 mEq/L (ref 19–32)
Chloride: 98 mEq/L (ref 96–112)
Creatinine, Ser: 1.73 mg/dL — ABNORMAL HIGH (ref 0.50–1.10)
GFR calc non Af Amer: 27 mL/min — ABNORMAL LOW (ref >90)
Glucose, Bld: 198 mg/dL — ABNORMAL HIGH (ref 70–99)
Sodium: 139 mEq/L (ref 135–145)
Total Bilirubin: 0.3 mg/dL (ref 0.3–1.2)

## 2011-09-25 LAB — URINE MICROSCOPIC-ADD ON

## 2011-09-25 MED ORDER — SODIUM CHLORIDE 0.9 % IV SOLN
Freq: Once | INTRAVENOUS | Status: AC
  Administered 2011-09-25: 15:00:00 via INTRAVENOUS

## 2011-09-25 MED ORDER — MORPHINE SULFATE 4 MG/ML IJ SOLN
4.0000 mg | Freq: Once | INTRAMUSCULAR | Status: AC
  Administered 2011-09-25: 4 mg via INTRAVENOUS
  Filled 2011-09-25: qty 1

## 2011-09-25 MED ORDER — ACETAMINOPHEN 325 MG PO TABS
650.0000 mg | ORAL_TABLET | Freq: Once | ORAL | Status: AC
  Administered 2011-09-25: 650 mg via ORAL
  Filled 2011-09-25: qty 2

## 2011-09-25 MED ORDER — HYDROCODONE-ACETAMINOPHEN 10-325 MG PO TABS: 1.0000 | ORAL_TABLET | ORAL | Status: DC | PRN

## 2011-09-25 MED ORDER — ONDANSETRON HCL 4 MG/2ML IJ SOLN
4.0000 mg | Freq: Once | INTRAMUSCULAR | Status: AC
  Administered 2011-09-25: 4 mg via INTRAVENOUS
  Filled 2011-09-25: qty 2

## 2011-09-25 NOTE — ED Notes (Signed)
Per EMS, abdominal pain for one month-starting vomiting this am-states possible hernia-was to have CT scan today

## 2011-09-25 NOTE — ED Notes (Signed)
ION:[id number]<MW> Expected date:09/25/11<BR> Expected time:<BR> Means of arrival:<BR> Comments:<BR> EMS RK - abd pain

## 2011-09-25 NOTE — Discharge Instructions (Signed)
Hernia  A hernia occurs when an internal organ pushes out through a weak spot in the abdominal wall. Hernias most commonly occur in the groin and around the navel. Hernias often can be pushed back into place (reduced). Most hernias tend to get worse over time. Some abdominal hernias can get stuck in the opening (irreducible or incarcerated hernia) and cannot be reduced. An irreducible abdominal hernia which is tightly squeezed into the opening is at risk for impaired blood supply (strangulated hernia). A strangulated hernia is a medical emergency. Because of the risk for an irreducible or strangulated hernia, surgery may be recommended to repair a hernia.  CAUSES     Heavy lifting.    Prolonged coughing.    Straining to have a bowel movement.    A cut (incision) made during an abdominal surgery.   HOME CARE INSTRUCTIONS     Bed rest is not required. You may continue your normal activities.    Avoid lifting more than 10 pounds (4.5 kg) or straining.    Cough gently. If you are a smoker it is best to stop. Even the best hernia repair can break down with the continual strain of coughing. Even if you do not have your hernia repaired, a cough will continue to aggravate the problem.    Do not wear anything tight over your hernia. Do not try to keep it in with an outside bandage or truss. These can damage abdominal contents if they are trapped within the hernia sac.    Eat a normal diet.    Avoid constipation. Straining over long periods of time will increase hernia size and encourage breakdown of repairs. If you cannot do this with diet alone, stool softeners may be used.   SEEK IMMEDIATE MEDICAL CARE IF:     You have a fever.    You develop increasing abdominal pain.    You feel nauseous or vomit.    Your hernia is stuck outside the abdomen, looks discolored, feels hard, or is tender.    You have any changes in your bowel habits or in the hernia that are unusual for you.     You have increased pain or swelling around the hernia.    You cannot push the hernia back in place by applying gentle pressure while lying down.   MAKE SURE YOU:     Understand these instructions.    Will watch your condition.    Will get help right away if you are not doing well or get worse.   Document Released: 06/15/2005 Document Revised: 06/04/2011 Document Reviewed: 02/02/2008  [employer] Patient Information 2012 [employer]    Hernia, Surgical Repair  A hernia occurs when an internal organ pushes out through a weak spot in the belly (abdominal) wall muscles. Hernias commonly occur in the groin and around the navel. Hernias often can be pushed back into place (reduced). Most hernias tend to get worse over time. Problems occur when abdominal contents get stuck in the opening (incarcerated hernia). The blood supply gets cut off (strangulated hernia). This is an emergency and needs surgery. Otherwise, hernia repair can be an elective procedure. This means you can schedule this at your convenience when an emergency is not present. Because complications can occur, if you decide to repair the hernia, it is best to do it soon. When it becomes an emergency procedure, there is increased risk of complications after surgery.  CAUSES     Heavy lifting.    Obesity.      Prolonged coughing.    Straining to move your bowels.    Hernias can also occur through a cut (incision) by a surgeonafter an abdominal operation.   HOME CARE INSTRUCTIONS  Before the repair:   Bed rest is not required. You may continue your normal activities, but avoid heavy lifting (more than 10 pounds) or straining. Cough gently. If you are a smoker, it is best to stop. Even the best hernia repair can break down with the continual strain of coughing.    Do not wear anything tight over your hernia. Do not try to keep it in with an outside bandage or truss. These can damage abdominal contents if they are trapped in the hernia sac.     Eat a normal diet. Avoid constipation. Straining over long periods of time to have a bowel movement will increase hernia size. It also can breakdown repairs. If you cannot do this with diet alone, laxatives or stool softeners may be used.   PRIOR TO SURGERY, SEEK IMMEDIATE MEDICAL CARE IF:  You have problems (symptoms) of a trapped (incarcerated) hernia. Symptoms include:   An oral temperature above 102 F (38.9 C) develops, or as your caregiver suggests.    Increasing abdominal pain.    Feeling sick to your stomach(nausea) and vomiting.    You stop passing gas or stool.    The hernia is stuck outside the abdomen, looks discolored, feels hard, or is tender.    You have any changes in your bowel habits or in the hernia that is unusual for you.   LET YOUR CAREGIVERS KNOW ABOUT THE FOLLOWING:   Allergies.    Medications taken including herbs, eye drops, over the counter medications, and creams.    Use of steroids (by mouth or creams).    Family or personal history of problems with anesthetics or Novocaine.    Possibility of pregnancy, if this applies.    Personal history of blood clots (thrombophlebitis).    Family or personal history of bleeding or blood problems.    Previous surgery.    Other health problems.   BEFORE THE PROCEDURE  You should be present 1 hour prior to your procedure, or as directed by your caregiver.    AFTER THE PROCEDURE  After surgery, you will be taken to the recovery area. A nurse will watch and check your progress there. Once you are awake, stable, and taking fluids well, you will be allowed to go home as long as there are no problems. Once home, an ice pack (wrapped in a light towel) applied to your operative site may help with discomfort. It may also keep the swelling down. Do not lift anything heavier than 10 pounds (4.55 kilograms). Take showers not baths. Do not drive while taking narcotics. Follow instructions as suggested by your caregiver.     SEEK IMMEDIATE MEDICAL CARE IF:  After surgery:   There is redness, swelling, or increasing pain in the wound.    There is pus coming from the wound.    There is drainage from a wound lasting longer than 1 day.    An unexplained oral temperature above 102 F (38.9 C) develops.    You notice a foul smell coming from the wound or dressing.    There is a breaking open of a wound (edged not staying together) after the sutures have been removed.    You notice increasing pain in the shoulders (shoulder strap areas).    You develop dizzy episodes or fainting

## 2011-09-25 NOTE — ED Notes (Signed)
Patient discharged via stretcher by [facility]. Respirations equal and unlabored. Skin warm and dry. No acute distress noted.

## 2011-09-25 NOTE — ED Provider Notes (Signed)
History     CSN: [id number]  Arrival date & time 09/25/11  1407   First MD Initiated Contact with Patient 09/25/11 1404      Chief Complaint  Patient presents with  . Abdominal Pain    (Consider location/radiation/quality/duration/timing/severity/associated sxs/prior treatment) HPI Comments: Patient presents via EMS from a nursing home with one month of abdominal pain. EMS reports that she had vomiting this morning which patient denies. Patient complains of a "knot in her stomach" and has been there for quite some time. She denies any fever, diarrhea, chest pain or shortness of breath. Denies any difficulty with urination. She's on chronic oxygen for COPD and obesity hypoventilation syndrome.  The history is provided by the patient and the EMS personnel.    Past Medical History  Diagnosis Date  . CHF (congestive heart failure)   . COPD (chronic obstructive pulmonary disease)   . Hypertension   . Diabetes mellitus     Past Surgical History  Procedure Date  . Cholecystectomy     History reviewed. No pertinent family history.  History  Substance Use Topics  . Smoking status: Never Smoker   . Smokeless tobacco: Not on file  . Alcohol Use: No    OB History    Grav Para Term Preterm Abortions TAB SAB Ect Mult Living                  Review of Systems  Constitutional: Positive for fatigue. Negative for fever and activity change.  HENT: Negative for congestion and rhinorrhea.   Respiratory: Negative for cough, chest tightness and shortness of breath.   Cardiovascular: Negative for chest pain.  Gastrointestinal: Positive for nausea, vomiting and abdominal pain. Negative for diarrhea.  Genitourinary: Negative for dysuria, hematuria, vaginal bleeding and vaginal discharge.  Musculoskeletal: Negative for back pain.  Skin: Negative for rash.  Neurological: Negative for headaches.    Allergies  Review of patient's allergies indicates no known allergies.  Home  Medications  No current outpatient prescriptions on file.  BP 199/94  Pulse 104  Temp(Src) 100.9 F (38.3 C) (Oral)  Resp 20  SpO2 94%  Physical Exam  Constitutional: She is oriented to person, place, and time. She appears well-developed and well-nourished. No distress.  HENT:  Head: Normocephalic and atraumatic.  Mouth/Throat: Oropharynx is clear and moist. No oropharyngeal exudate.  Eyes: Conjunctivae are normal. Pupils are equal, round, and reactive to light.  Neck: Normal range of motion. Neck supple.  Cardiovascular: Normal rate, regular rhythm and normal heart sounds.   Pulmonary/Chest: Effort normal and breath sounds normal. No respiratory distress.  Abdominal: Soft. There is tenderness. There is no rebound and no guarding.       Morbid obese, reducible midline ventral hernia in the midline. Abdomen soft no guarding or rebound  Musculoskeletal: Normal range of motion. She exhibits no edema and no tenderness.  Neurological: She is alert and oriented to person, place, and time. No cranial nerve deficit.  Skin: Skin is warm.    ED Course  Procedures (including critical care time)   Labs Reviewed  CBC  DIFFERENTIAL  COMPREHENSIVE METABOLIC PANEL  LIPASE, BLOOD  LACTIC ACID, PLASMA  TROPONIN I  URINALYSIS, ROUTINE W REFLEX MICROSCOPIC   No results found.   No diagnosis found.    MDM  Acute chronic abdominal pain with ventral hernia. Report of vomiting this morning. Patient's obesity makes exam difficult.  Obtain labs, urinalysis, CT scan.  Labs and imaging pending at time of sign out  to Dr. Fredricka Bonine who will make disposition decision.   Date: 09/25/2011  Rate: 103  Rhythm: sinus tachycardia  QRS Axis: normal  Intervals: normal  ST/T Wave abnormalities: normal  Conduction Disutrbances:none  Narrative Interpretation:   Old EKG Reviewed: unchanged         Glynn Octave, MD 09/25/11 [phone]

## 2011-09-25 NOTE — ED Notes (Addendum)
Daughter and pt gave verbal consent to rn that rn could discuss medical treatment with staff at [facility] when they called. [facility] staff called to ask if pt was being admitted or not. rn reported that was unknown at this time.

## 2011-09-25 NOTE — ED Notes (Signed)
Report given to rashell, rn

## 2011-09-25 NOTE — ED Provider Notes (Signed)
10:29 PM After her laboratory workup was completed, no significant abnormalities were found and no suggestion of mesenteric ischemia or infection. Her CT scan further corroborated this impression, and additionally showed a supraumbilical ventral abdominal hernia with a small amount of colon herniated through the ventral defect but with no sign of ischemia or obstruction. I went back and reevaluated the patient and this finding correlates to the evident protrusion or hernia at this location which is soft, mildly tender to palpation, but reducible. I informed the patient of the findings and that this is the cause of her pain, that I would prescribe her pain medication to use until she could follow up as an outpatient with the general surgery office for further evaluation of the hernia and whether it may be amenable to surgical repair. I did inform her that sometimes these hernias do not benefit from surgical repair giving consideration for the overall health the patient and that this evaluation would need to be made by the surgeon. I further instructed her that should she develop signs of incarceration or obstruction of the hernia that she should return immediately to the emergency department for reevaluation and that this would be an indication for emergency surgery. She stated her understanding of and agreement with the plan of care.  Felisa Bonier, MD 09/25/11 2233

## 2011-09-25 NOTE — ED Notes (Signed)
Daughter Brandi [name] , home  [phone]   Cell [phone]

## 2011-09-25 NOTE — ED Notes (Signed)
Pt given 30 ml of ice chips

## 2011-09-25 NOTE — Progress Notes (Signed)
ED CM noted pt without pcp or coverage listed Spoke with pt who confirms Brandi [name], Brandi [name] as pcp.  Pt states she saw him "look in my room and went on by" Reports she is interested in finding a new doctor and has insurance coverage.

## 2011-10-16 ENCOUNTER — Ambulatory Visit (INDEPENDENT_AMBULATORY_CARE_PROVIDER_SITE_OTHER): Payer: Medicare Other | Admitting: General Surgery

## 2011-10-16 ENCOUNTER — Encounter (INDEPENDENT_AMBULATORY_CARE_PROVIDER_SITE_OTHER): Payer: Self-pay | Admitting: General Surgery

## 2011-10-16 DIAGNOSIS — K439 Ventral hernia without obstruction or gangrene: Secondary | ICD-10-CM

## 2011-10-16 NOTE — Progress Notes (Signed)
Subjective:     Patient ID: Brandi [name], female   DOB: 1929-05-02, 78 y.o.   MRN: [id number]  HPI This is an 78 year old female who has an unknown length of time history of a supraumbilical bulge. She states that she has no pain related to this ever. She states it has goes back in and does get bigger and smaller at times. She reports that she is having normal bowel movements and passing flatus without any difficulty. She has some chronic more generalized abdominal pain and I'm unsure of where this is from. She was evaluated the emergency room a couple of weeks ago and was found to have this hernia with some colon present in the hernia sac. She was then referred for evaluation. I don't think that her symptoms at that time were due to her hernia.  Review of Systems CT ABDOMEN AND PELVIS WITHOUT CONTRAST  Technique: Multidetector CT imaging of the abdomen and pelvis was  performed following the standard protocol without intravenous  contrast.  Comparison: Multiple priors, most recently CT of the abdomen and  pelvis 11/06/2009.  Findings:  Lung Bases: In the visualized lung bases there are numerous  pulmonary nodules scattered in a random distribution, with the  largest single nodule noted in the inferior segment of the lingula  (image one of series three), measuring up to 7 mm. However, these  appear unchanged compared to remote prior examinations 01/19/2007,  suggesting a benign etiology. Calcified granuloma in the right  lower lobe. Dependent atelectasis in the lower lobes of the lungs  bilaterally. Moderate cardiomegaly (unchanged). Calcifications of  the aortic valve. Atherosclerotic calcifications in the left  circumflex and distal right coronary artery. Small hiatal hernia.  Abdomen/Pelvis: Status post cholecystectomy. The unenhanced  appearance of the liver, spleen and bilateral adrenal glands is  unremarkable. The pancreas is atrophic, but no definite focal  pancreatic lesions  are identified. There is a 6 mm nonobstructive  calculus in the lower pole collecting system of the right kidney.  No additional calculi are noted within the left renal collecting  system or along the course of either ureter (please note that the  patient does have numerous phleboliths within the gonadal veins  bilaterally, which appear at first glance suspicious for potential  ureteral calculi, however, they are not). The urinary bladder is  completely decompressed with a Foley balloon catheter in place.  There is a small ventral hernia containing only some omental fat  (this is unchanged). However, compared to the prior examination,  there is a new supraumbilical midline ventral hernia which contains  a short segment of the mid transverse colon. At this time, there  is no pathologic distension of bowel to suggest the presence of  bowel obstruction related to this hernia. There are a few colonic  diverticula, without surrounding inflammatory changes to suggest an  acute diverticulitis. No ascites or pneumoperitoneum. The patient  is status post hysterectomy. Ovaries are not confidently  visualized, and may be surgically absent or atrophic.  Musculoskeletal: There are no aggressive appearing lytic or blastic  lesions noted in the visualized portions of the skeleton.  IMPRESSION:  1. Interval development of a small supraumbilical ventral hernia  which contains a short segment of the mid transverse colon. At  this time, there is no evidence of frank bowel obstruction related  to this entrapped mid transverse colon, however, intermittent  obstruction is possible and may be a source of the patient's  symptoms. Clinical correlation is recommended.  2. Persistent small umbilical hernia containing only omental fat,  unchanged.  3. Nonobstructive 6 mm calculus in the lower pole collecting  system of the right kidney.  4. Colonic diverticulosis without findings to suggest acute    diverticulitis.  5. Status post cholecystectomy and total abdominal hysterectomy.  6. Atherosclerosis, including at least two-vessel coronary artery  disease. Please note that although the presence of coronary artery  calcium documents the presence of coronary artery disease, the  severity of this disease and any potential stenosis cannot be  assessed on this non-gated CT examination. Assessment for  potential risk factor modification, dietary therapy or  pharmacologic therapy may be warranted, if clinically indicated.  7. There are calcifications of the aortic valve. Echocardiographic  correlation for evaluation of potential valvular dysfunction may be  warranted if clinically indicated.  8. Small hiatal hernia.     Objective:   Physical Exam  Vitals reviewed. Constitutional: She appears well-developed and well-nourished.  Abdominal: Normal appearance and bowel sounds are normal. A hernia (supraumbilical hernia that is nontender and very easily reducible.) is present.         Assessment:     Ventral hernia     Plan:     She does have a ventral hernia. This shows up on her CT scan. This is easily reducible and not tender. I asked her what she was in emergency where her pain was and it was not really related to her hernia. She has no evidence of any obstruction or pain it comes from this hernia. I think due to her comorbidities as well as her body habitus the best plan would be to just monitor this right now. She understands that there is a risk of incarcerating an emergency surgery but I think this is absolutely the best decision for her.

## 2014-01-04 ENCOUNTER — Inpatient Hospital Stay (HOSPITAL_COMMUNITY): Payer: PRIVATE HEALTH INSURANCE

## 2014-01-04 ENCOUNTER — Inpatient Hospital Stay (HOSPITAL_COMMUNITY)
Admission: AD | Admit: 2014-01-04 | Discharge: 2014-01-15 | DRG: 329 | Disposition: A | Discharge status: Skilled Nursing Facility | Payer: PRIVATE HEALTH INSURANCE | Source: Other Acute Inpatient Hospital | Attending: Internal Medicine | Admitting: Internal Medicine

## 2014-01-04 DIAGNOSIS — K436 Other and unspecified ventral hernia with obstruction, without gangrene: Secondary | ICD-10-CM

## 2014-01-04 DIAGNOSIS — Y834 Other reconstructive surgery as the cause of abnormal reaction of the patient, or of later complication, without mention of misadventure at the time of the procedure: Secondary | ICD-10-CM | POA: Diagnosis not present

## 2014-01-04 DIAGNOSIS — K279 Peptic ulcer, site unspecified, unspecified as acute or chronic, without hemorrhage or perforation: Secondary | ICD-10-CM

## 2014-01-04 DIAGNOSIS — D72829 Elevated white blood cell count, unspecified: Secondary | ICD-10-CM

## 2014-01-04 DIAGNOSIS — E860 Dehydration: Secondary | ICD-10-CM

## 2014-01-04 DIAGNOSIS — D62 Acute posthemorrhagic anemia: Secondary | ICD-10-CM | POA: Diagnosis present

## 2014-01-04 DIAGNOSIS — I359 Nonrheumatic aortic valve disorder, unspecified: Secondary | ICD-10-CM | POA: Diagnosis present

## 2014-01-04 DIAGNOSIS — T83511A Infection and inflammatory reaction due to indwelling urethral catheter, initial encounter: Secondary | ICD-10-CM

## 2014-01-04 DIAGNOSIS — Z6841 Body Mass Index (BMI) 40.0 and over, adult: Secondary | ICD-10-CM | POA: Diagnosis not present

## 2014-01-04 DIAGNOSIS — E119 Type 2 diabetes mellitus without complications: Secondary | ICD-10-CM | POA: Diagnosis present

## 2014-01-04 DIAGNOSIS — F3289 Other specified depressive episodes: Secondary | ICD-10-CM | POA: Diagnosis present

## 2014-01-04 DIAGNOSIS — I509 Heart failure, unspecified: Secondary | ICD-10-CM | POA: Diagnosis present

## 2014-01-04 DIAGNOSIS — N179 Acute kidney failure, unspecified: Secondary | ICD-10-CM

## 2014-01-04 DIAGNOSIS — Z66 Do not resuscitate: Secondary | ICD-10-CM | POA: Diagnosis present

## 2014-01-04 DIAGNOSIS — J4489 Other specified chronic obstructive pulmonary disease: Secondary | ICD-10-CM | POA: Diagnosis present

## 2014-01-04 DIAGNOSIS — K449 Diaphragmatic hernia without obstruction or gangrene: Secondary | ICD-10-CM | POA: Diagnosis present

## 2014-01-04 DIAGNOSIS — M1990 Unspecified osteoarthritis, unspecified site: Secondary | ICD-10-CM

## 2014-01-04 DIAGNOSIS — J9620 Acute and chronic respiratory failure, unspecified whether with hypoxia or hypercapnia: Secondary | ICD-10-CM | POA: Diagnosis present

## 2014-01-04 DIAGNOSIS — R109 Unspecified abdominal pain: Secondary | ICD-10-CM | POA: Diagnosis present

## 2014-01-04 DIAGNOSIS — Z8711 Personal history of peptic ulcer disease: Secondary | ICD-10-CM | POA: Diagnosis not present

## 2014-01-04 DIAGNOSIS — R062 Wheezing: Secondary | ICD-10-CM

## 2014-01-04 DIAGNOSIS — D126 Benign neoplasm of colon, unspecified: Secondary | ICD-10-CM

## 2014-01-04 DIAGNOSIS — J449 Chronic obstructive pulmonary disease, unspecified: Secondary | ICD-10-CM | POA: Diagnosis present

## 2014-01-04 DIAGNOSIS — K5730 Diverticulosis of large intestine without perforation or abscess without bleeding: Secondary | ICD-10-CM

## 2014-01-04 DIAGNOSIS — N183 Chronic kidney disease, stage 3 unspecified: Secondary | ICD-10-CM

## 2014-01-04 DIAGNOSIS — Z7401 Bed confinement status: Secondary | ICD-10-CM

## 2014-01-04 DIAGNOSIS — E870 Hyperosmolality and hypernatremia: Secondary | ICD-10-CM

## 2014-01-04 DIAGNOSIS — IMO0002 Reserved for concepts with insufficient information to code with codable children: Secondary | ICD-10-CM | POA: Diagnosis not present

## 2014-01-04 DIAGNOSIS — K219 Gastro-esophageal reflux disease without esophagitis: Secondary | ICD-10-CM | POA: Diagnosis present

## 2014-01-04 DIAGNOSIS — E785 Hyperlipidemia, unspecified: Secondary | ICD-10-CM | POA: Diagnosis present

## 2014-01-04 DIAGNOSIS — J9601 Acute respiratory failure with hypoxia: Secondary | ICD-10-CM

## 2014-01-04 DIAGNOSIS — I10 Essential (primary) hypertension: Secondary | ICD-10-CM

## 2014-01-04 DIAGNOSIS — R0600 Dyspnea, unspecified: Secondary | ICD-10-CM

## 2014-01-04 DIAGNOSIS — Z9981 Dependence on supplemental oxygen: Secondary | ICD-10-CM | POA: Diagnosis not present

## 2014-01-04 DIAGNOSIS — M79609 Pain in unspecified limb: Secondary | ICD-10-CM | POA: Diagnosis present

## 2014-01-04 DIAGNOSIS — K430 Incisional hernia with obstruction, without gangrene: Principal | ICD-10-CM | POA: Diagnosis present

## 2014-01-04 DIAGNOSIS — R197 Diarrhea, unspecified: Secondary | ICD-10-CM

## 2014-01-04 DIAGNOSIS — N390 Urinary tract infection, site not specified: Secondary | ICD-10-CM

## 2014-01-04 DIAGNOSIS — I129 Hypertensive chronic kidney disease with stage 1 through stage 4 chronic kidney disease, or unspecified chronic kidney disease: Secondary | ICD-10-CM | POA: Diagnosis present

## 2014-01-04 DIAGNOSIS — E46 Unspecified protein-calorie malnutrition: Secondary | ICD-10-CM | POA: Diagnosis present

## 2014-01-04 DIAGNOSIS — R609 Edema, unspecified: Secondary | ICD-10-CM

## 2014-01-04 DIAGNOSIS — K429 Umbilical hernia without obstruction or gangrene: Secondary | ICD-10-CM | POA: Diagnosis present

## 2014-01-04 DIAGNOSIS — F329 Major depressive disorder, single episode, unspecified: Secondary | ICD-10-CM | POA: Diagnosis present

## 2014-01-04 DIAGNOSIS — K560 Paralytic ileus: Secondary | ICD-10-CM

## 2014-01-04 DIAGNOSIS — O039 Complete or unspecified spontaneous abortion without complication: Secondary | ICD-10-CM

## 2014-01-04 DIAGNOSIS — J984 Other disorders of lung: Secondary | ICD-10-CM

## 2014-01-04 DIAGNOSIS — D509 Iron deficiency anemia, unspecified: Secondary | ICD-10-CM | POA: Diagnosis present

## 2014-01-04 HISTORY — DX: Shortness of breath: R06.02

## 2014-01-04 HISTORY — DX: Acute embolism and thrombosis of unspecified deep veins of unspecified lower extremity: I82.409

## 2014-01-04 LAB — CBC WITH DIFFERENTIAL/PLATELET
BASOS PCT: 0 % (ref 0–1)
Basophils Absolute: 0 10*3/uL (ref 0.0–0.1)
EOS ABS: 0.2 10*3/uL (ref 0.0–0.7)
Eosinophils Relative: 1 % (ref 0–5)
HCT: 23.1 % — ABNORMAL LOW (ref 36.0–46.0)
HEMOGLOBIN: 7.4 g/dL — AB (ref 12.0–15.0)
Lymphocytes Relative: 5 % — ABNORMAL LOW (ref 12–46)
Lymphs Abs: 0.8 10*3/uL (ref 0.7–4.0)
MCH: 29.4 pg (ref 26.0–34.0)
MCHC: 32 g/dL (ref 30.0–36.0)
MCV: 91.7 fL (ref 78.0–100.0)
MONOS PCT: 7 % (ref 3–12)
Monocytes Absolute: 1.1 10*3/uL — ABNORMAL HIGH (ref 0.1–1.0)
NEUTROS ABS: 13.1 10*3/uL — AB (ref 1.7–7.7)
NEUTROS PCT: 87 % — AB (ref 43–77)
PLATELETS: 221 10*3/uL (ref 150–400)
RBC: 2.52 MIL/uL — ABNORMAL LOW (ref 3.87–5.11)
RDW: 13.8 % (ref 11.5–15.5)
WBC: 15.3 10*3/uL — ABNORMAL HIGH (ref 4.0–10.5)

## 2014-01-04 LAB — GLUCOSE, CAPILLARY
GLUCOSE-CAPILLARY: 89 mg/dL (ref 70–99)
Glucose-Capillary: 95 mg/dL (ref 70–99)

## 2014-01-04 LAB — COMPREHENSIVE METABOLIC PANEL
ALT: 11 U/L (ref 0–35)
ANION GAP: 17 — AB (ref 5–15)
AST: 11 U/L (ref 0–37)
Albumin: 1.8 g/dL — ABNORMAL LOW (ref 3.5–5.2)
Alkaline Phosphatase: 69 U/L (ref 39–117)
BUN: 91 mg/dL — AB (ref 6–23)
CALCIUM: 8.1 mg/dL — AB (ref 8.4–10.5)
CO2: 23 mEq/L (ref 19–32)
CREATININE: 3.04 mg/dL — AB (ref 0.50–1.10)
Chloride: 104 mEq/L (ref 96–112)
GFR calc non Af Amer: 13 mL/min — ABNORMAL LOW (ref >90)
GFR, EST AFRICAN AMERICAN: 15 mL/min — AB (ref >90)
GLUCOSE: 89 mg/dL (ref 70–99)
Potassium: 4.1 mEq/L (ref 3.7–5.3)
Sodium: 144 mEq/L (ref 137–147)
TOTAL PROTEIN: 5.3 g/dL — AB (ref 6.0–8.3)
Total Bilirubin: <0.2 mg/dL — ABNORMAL LOW (ref 0.3–1.2)

## 2014-01-04 LAB — TSH: TSH: 3.54 u[IU]/mL (ref 0.350–4.500)

## 2014-01-04 LAB — MRSA PCR SCREENING: MRSA by PCR: POSITIVE — AB

## 2014-01-04 LAB — TROPONIN I

## 2014-01-04 MED ORDER — CARVEDILOL 12.5 MG PO TABS
12.5000 mg | ORAL_TABLET | Freq: Two times a day (BID) | ORAL | Status: DC
  Administered 2014-01-05: 12.5 mg via ORAL
  Filled 2014-01-04 (×3): qty 1

## 2014-01-04 MED ORDER — INSULIN GLARGINE 100 UNIT/ML ~~LOC~~ SOLN
25.0000 [IU] | Freq: Every day | SUBCUTANEOUS | Status: DC
  Filled 2014-01-04: qty 0.25

## 2014-01-04 MED ORDER — DOCUSATE SODIUM 100 MG PO CAPS
100.0000 mg | ORAL_CAPSULE | Freq: Two times a day (BID) | ORAL | Status: DC
  Administered 2014-01-05 – 2014-01-08 (×8): 100 mg via ORAL
  Filled 2014-01-04 (×9): qty 1

## 2014-01-04 MED ORDER — DARBEPOETIN ALFA-POLYSORBATE 25 MCG/0.42ML IJ SOLN
25.0000 ug | INTRAMUSCULAR | Status: DC
  Administered 2014-01-06: 25 ug via SUBCUTANEOUS
  Filled 2014-01-04 (×2): qty 0.42

## 2014-01-04 MED ORDER — SODIUM CHLORIDE 0.9 % IV SOLN
INTRAVENOUS | Status: DC
  Administered 2014-01-04 – 2014-01-08 (×6): via INTRAVENOUS
  Administered 2014-01-08: 60 mL/h via INTRAVENOUS
  Administered 2014-01-09 – 2014-01-10 (×2): via INTRAVENOUS

## 2014-01-04 MED ORDER — HYDROCODONE-ACETAMINOPHEN 10-325 MG PO TABS
1.0000 | ORAL_TABLET | Freq: Two times a day (BID) | ORAL | Status: DC | PRN
  Administered 2014-01-05 – 2014-01-09 (×5): 1 via ORAL
  Filled 2014-01-04 (×5): qty 1

## 2014-01-04 MED ORDER — ACETAMINOPHEN 500 MG PO TABS
1000.0000 mg | ORAL_TABLET | Freq: Four times a day (QID) | ORAL | Status: DC | PRN
  Administered 2014-01-05 (×2): 1000 mg via ORAL
  Filled 2014-01-04 (×2): qty 2

## 2014-01-04 MED ORDER — FUROSEMIDE 40 MG PO TABS
40.0000 mg | ORAL_TABLET | Freq: Two times a day (BID) | ORAL | Status: DC
  Administered 2014-01-05: 40 mg via ORAL
  Filled 2014-01-04 (×3): qty 1

## 2014-01-04 MED ORDER — ASPIRIN 325 MG PO TABS
325.0000 mg | ORAL_TABLET | Freq: Every day | ORAL | Status: DC
  Administered 2014-01-04 – 2014-01-05 (×2): 325 mg via ORAL
  Filled 2014-01-04 (×3): qty 1

## 2014-01-04 MED ORDER — HEPARIN SODIUM (PORCINE) 5000 UNIT/ML IJ SOLN
5000.0000 [IU] | Freq: Three times a day (TID) | INTRAMUSCULAR | Status: DC
  Administered 2014-01-04 – 2014-01-05 (×3): 5000 [IU] via SUBCUTANEOUS
  Filled 2014-01-04 (×5): qty 1

## 2014-01-04 MED ORDER — BUDESONIDE-FORMOTEROL FUMARATE 160-4.5 MCG/ACT IN AERO
2.0000 | INHALATION_SPRAY | Freq: Two times a day (BID) | RESPIRATORY_TRACT | Status: DC
  Administered 2014-01-05 – 2014-01-15 (×18): 2 via RESPIRATORY_TRACT
  Filled 2014-01-04: qty 6

## 2014-01-04 MED ORDER — LORATADINE 10 MG PO TABS
10.0000 mg | ORAL_TABLET | Freq: Every day | ORAL | Status: DC
  Administered 2014-01-05 – 2014-01-13 (×6): 10 mg via ORAL
  Filled 2014-01-04 (×9): qty 1

## 2014-01-04 MED ORDER — FERROUS SULFATE 325 (65 FE) MG PO TABS
325.0000 mg | ORAL_TABLET | Freq: Every day | ORAL | Status: DC
  Administered 2014-01-05 – 2014-01-07 (×3): 325 mg via ORAL
  Filled 2014-01-04 (×4): qty 1

## 2014-01-04 MED ORDER — GUAIFENESIN ER 600 MG PO TB12
600.0000 mg | ORAL_TABLET | Freq: Two times a day (BID) | ORAL | Status: DC
  Administered 2014-01-05 – 2014-01-07 (×6): 600 mg via ORAL
  Filled 2014-01-04 (×7): qty 1

## 2014-01-04 MED ORDER — AMLODIPINE BESYLATE 10 MG PO TABS
10.0000 mg | ORAL_TABLET | Freq: Every day | ORAL | Status: DC
  Administered 2014-01-05: 10 mg via ORAL
  Filled 2014-01-04: qty 1

## 2014-01-04 MED ORDER — FENTANYL 50 MCG/HR TD PT72
75.0000 ug | MEDICATED_PATCH | TRANSDERMAL | Status: DC
  Administered 2014-01-06: 75 ug via TRANSDERMAL
  Filled 2014-01-04 (×4): qty 1

## 2014-01-04 MED ORDER — IPRATROPIUM-ALBUTEROL 0.5-2.5 (3) MG/3ML IN SOLN: 3.0000 mL | Freq: Four times a day (QID) | RESPIRATORY_TRACT | Status: DC | PRN

## 2014-01-04 MED ORDER — GABAPENTIN 100 MG PO CAPS
200.0000 mg | ORAL_CAPSULE | Freq: Two times a day (BID) | ORAL | Status: DC
  Administered 2014-01-05 – 2014-01-06 (×4): 200 mg via ORAL
  Filled 2014-01-04 (×5): qty 2

## 2014-01-04 MED ORDER — INSULIN ASPART 100 UNIT/ML ~~LOC~~ SOLN: 0.0000 [IU] | SUBCUTANEOUS | Status: DC

## 2014-01-04 MED ORDER — ARTIFICIAL TEARS OP OINT: TOPICAL_OINTMENT | Freq: Every evening | OPHTHALMIC | Status: DC | PRN

## 2014-01-04 MED ORDER — CALCIUM CARBONATE ANTACID 500 MG PO CHEW
1.0000 | CHEWABLE_TABLET | Freq: Three times a day (TID) | ORAL | Status: DC
  Administered 2014-01-05 – 2014-01-15 (×22): 200 mg via ORAL
  Filled 2014-01-04 (×37): qty 1

## 2014-01-04 MED ORDER — FLUTICASONE PROPIONATE 50 MCG/ACT NA SUSP
2.0000 | Freq: Every day | NASAL | Status: DC
  Administered 2014-01-05 – 2014-01-15 (×8): 2 via NASAL
  Filled 2014-01-04 (×2): qty 16

## 2014-01-04 NOTE — H&P (Addendum)
Hospitalist Admission History and Physical  Patient name: Brandi [name] Medical record number: [id number] Date of birth: Feb 05, 1929 Age: 80 y.o. Gender: female  Primary Care Provider: Cleda Mccreedy, MD  Chief Complaint: abd pain, umbilical hernia   History of Present Illness:This is a 80 y.o. year old female multiple medical problems including morbid obesity, bed bound status, HTN, DM, CKD, HLD, ventral hernia presenting with abd pain, ? Incarcerated ventral hernia. Pt is a SNF resident near [facility]. Per report from grandson, patient has had recurrent abdominal pain over the past several months related to a ventral hernia. Stasis she had acute worsening of abdominal pain over the past 24-48 hours. Was subsequently sent to the [facility] from the nursing home for further evaluation. Patient had imaging done at [facility] that was concerning for an incarcerated hernia. Patient was refused by general surgery at [facility] given multiple medical problems. Case was discussed with general surgery at [facility]. Patient was accepted to [facility] with medical admission and surgery consulting.  Patient is an overall poor historian however currently denies any nausea or vomiting. Abdominal pain is mild. Does report having some left arm pain. No prior history of heart attack. Denies any dysuria or or diarrhea. Labs and imagine reviewed from [facility]:  CT Abd and Pel: mid colonic obstruction 2/2 mid transverse colon entering umbilical hernia  CXR: No acute findings WBC 13.8, hgb 10, K 4.6, BUN 93, Cr 3.5, glu 156,- labs otherwise WNL  Assessment and Plan: Brandi [name] is a 80 y.o. year old female presenting with abdominal pain,? Incarcerated hernia  Abdominal pain: Pending surgery followup. Exam is fairly benign today thus far. Follow up with surgery recommendations. Pain control gentle hydration in the interim. Noted L arm pain. ? Cardiac vs. GI source (  has been present for extended period of time-no active CP). Check EKG and trop. Baby ASA.   Hypertension: Clinically stable currently. Restart home regimen pending medical reconciliation.  Leg pain: sub acute on chronic issue per grandson from his recollection. Check d dimer and LE U/S. If d dimer markedly elevated may need VQ scan ( CTAngio relatively contraindicated in setting of CKD)  CKD: Cr pending.   DM: SSI. A1C   FEN/GI: NPO for now.  Prophylaxis: sub heparin  Disposition: pending further evaluation  Code Status:DNR (per nurse discussion with daughter)   Patient Active Problem List   Diagnosis Date Noted  . Pain, abdominal 01/04/2014  . Ventral hernia without mention of obstruction or gangrene 10/16/2011  . COLONIC POLYPS 05/20/2008  . DIABETES MELLITUS 05/20/2008  . MORBID OBESITY 05/20/2008  . ANEMIA, IRON DEFICIENCY, CHRONIC 05/20/2008  . HYPERTENSION 05/20/2008  . AORTIC STENOSIS, MILD 05/20/2008  . LUNG NODULE 05/20/2008  . GERD 05/20/2008  . PEPTIC ULCER DISEASE 05/20/2008  . HIATAL HERNIA 05/20/2008  . DIVERTICULOSIS OF COLON 05/20/2008  . ABORTION, SPONTANEOUS 05/20/2008  . DEGENERATIVE JOINT DISEASE 05/20/2008  . PERIPHERAL EDEMA 05/20/2008  . WHEEZING 05/20/2008  . NAUSEA AND VOMITING 05/20/2008  . DIARRHEA 05/20/2008  . ABDOMINAL PAIN, CHRONIC 05/20/2008   Past Medical History: Past Medical History  Diagnosis Date  . CHF (congestive heart failure)   . COPD (chronic obstructive pulmonary disease)   . Hypertension   . Diabetes mellitus   . Renal disorder   . Schmorl's nodes   . Muscle weakness   . Chronic pain   . GERD (gastroesophageal reflux disease)   . Chronic bronchitis   . Methylprednisolone-induced hypoxia   .  Depression   . Anemia   . Morbid obesity     Past Surgical History: Past Surgical History  Procedure Laterality Date  . Cholecystectomy    . Abdominal hysterectomy      Social History: History   Social History  .  Marital Status: Widowed    Spouse Name: N/A    Number of Children: N/A  . Years of Education: N/A   Social History Main Topics  . Smoking status: Never Smoker   . Smokeless tobacco: Not on file  . Alcohol Use: No  . Drug Use: No  . Sexual Activity:    Other Topics Concern  . Not on file   Social History Narrative  . No narrative on file    Family History: No family history on file.  Allergies: No Known Allergies  Current Facility-Administered Medications  Medication Dose Route Frequency Provider Last Rate Last Dose  . 0.9 %  sodium chloride infusion   Intravenous Continuous Shanda Howells, MD 75 mL/hr at 01/04/14 2013    . aspirin tablet 325 mg  325 mg Oral Daily Shanda Howells, MD      . heparin injection 5,000 Units  5,000 Units Subcutaneous 3 times per day Shanda Howells, MD      . insulin aspart (novoLOG) injection 0-15 Units  0-15 Units Subcutaneous 6 times per day Shanda Howells, MD       Review Of Systems: 12 point ROS negative except as noted above in HPI.  Physical Exam: Filed Vitals:   01/04/14 1942  BP: 138/62  Pulse: 77  Temp: 97.7 F (36.5 C)  Resp: 17    General: morbidly obese HEENT: PERRLA and extra ocular movement intact Heart: S1, S2 normal, no murmur, rub or gallop, regular rate and rhythm Lungs: clear to auscultation, no wheezes or rales and unlabored breathing Abdomen:obese abdomen, + ventral hernia, mildly decreased bowel sounds,  Extremities: veous stasis changes bilaterally, + popliteal tenderness bilaterally  Skin:as above  Neurology: normal without focal findings  Labs and Imaging: Lab Results  Component Value Date/Time   NA 139 09/25/2011  2:22 PM   K 4.0 09/25/2011  2:22 PM   CL 98 09/25/2011  2:22 PM   CO2 27 09/25/2011  2:22 PM   BUN 24* 09/25/2011  2:22 PM   CREATININE 1.73* 09/25/2011  2:22 PM   GLUCOSE 198* 09/25/2011  2:22 PM   Lab Results  Component Value Date   WBC 9.3 09/25/2011   HGB 10.8* 09/25/2011   HCT 36.1 09/25/2011    MCV 93.8 09/25/2011   PLT 208 09/25/2011    No results found.         Shanda Howells MD  Pager: [phone]

## 2014-01-04 NOTE — Consult Note (Signed)
Reason for Consult:Ventral hernia with incarceration and possible obstruction Referring Physician: Lorenza [name] is an 80 y.o. female.  HPI: Patient transferred here from [facility] with report of obstructed transverse colon in a large ventral hernia.  She has been known to have this hernia since  4/15, but never had any evidence of obstruction.  She has been evaluated by a surgeon previously.  Surgery was not recommended at that time.  Patient is morbidly obese with likely BMI > 50.  Since the CT was done yesterday demonstrating a distended right colon up to 14 cm, she had a large BM this AM and expelled a lot of gas.  Her abdomen is much less tender, but she was transferred to [facility] for consideration of repairing her hernia based on previous CT report.  Past Medical History  Diagnosis Date  . CHF (congestive heart failure)   . COPD (chronic obstructive pulmonary disease)   . Hypertension   . Diabetes mellitus   . Renal disorder   . Schmorl's nodes   . Muscle weakness   . Chronic pain   . GERD (gastroesophageal reflux disease)   . Chronic bronchitis   . Methylprednisolone-induced hypoxia   . Depression   . Anemia   . Morbid obesity     Past Surgical History  Procedure Laterality Date  . Cholecystectomy    . Abdominal hysterectomy      No family history on file.  Social History:  reports that she has never smoked. She does not have any smokeless tobacco history on file. She reports that she does not drink alcohol or use illicit drugs.  Allergies: No Known Allergies  Medications: I have reviewed the patient's current medications.  No results found for this or any previous visit (from the past 48 hour(s)).  No results found.  Review of Systems  Constitutional: Negative for fever and chills.  Gastrointestinal: Positive for abdominal pain.   Blood pressure 138/62, pulse 77, temperature 97.7 F (36.5 C), temperature source Oral, resp. rate 17, SpO2  96.00%. Physical Exam  Constitutional:  Morbidly obese  HENT:  Head: Normocephalic and atraumatic.  Eyes: Conjunctivae and EOM are normal. Pupils are equal, round, and reactive to light.  Neck: Normal range of motion. Neck supple.  Cardiovascular: Normal rate.   Respiratory: Effort normal and breath sounds normal.  GI: Soft. She exhibits distension. She exhibits no ascites and no pulsatile midline mass. Bowel sounds are decreased. There is no tenderness. There is no rigidity, no rebound and no guarding. A hernia is present. Hernia confirmed positive in the ventral area.    Musculoskeletal: Normal range of motion.  Neurological: She is alert.  Skin: Skin is warm and dry. There is pallor.    Assessment/Plan: Large ventral hernia with transverse colon incarcerated in the hernia.  Previously obstructed, but seems to have opened up and decompressed.  On my examination I was able to press deeply on the hernia without pain or tenderness, and obvious gas decompressed during the examination.    Her WBC is 17K and she is anemic, but her lactic acid level is only 0.9.  She has no fever.  No apparent free air.  Will recheck her X-rays, but I do not feel as though she is currently obstructed and requires an emergency operation.  She is in renal failure and if surgery is considered will need medical clearance.  Gwenyth Ober 01/04/2014, 8:19 PM

## 2014-01-05 ENCOUNTER — Encounter (HOSPITAL_COMMUNITY): Payer: Self-pay | Admitting: General Practice

## 2014-01-05 DIAGNOSIS — N183 Chronic kidney disease, stage 3 unspecified: Secondary | ICD-10-CM

## 2014-01-05 DIAGNOSIS — R109 Unspecified abdominal pain: Secondary | ICD-10-CM

## 2014-01-05 DIAGNOSIS — J9601 Acute respiratory failure with hypoxia: Secondary | ICD-10-CM | POA: Diagnosis present

## 2014-01-05 DIAGNOSIS — K439 Ventral hernia without obstruction or gangrene: Secondary | ICD-10-CM

## 2014-01-05 DIAGNOSIS — M79609 Pain in unspecified limb: Secondary | ICD-10-CM

## 2014-01-05 DIAGNOSIS — E860 Dehydration: Secondary | ICD-10-CM | POA: Diagnosis present

## 2014-01-05 DIAGNOSIS — N179 Acute kidney failure, unspecified: Secondary | ICD-10-CM | POA: Diagnosis present

## 2014-01-05 DIAGNOSIS — K436 Other and unspecified ventral hernia with obstruction, without gangrene: Secondary | ICD-10-CM | POA: Diagnosis present

## 2014-01-05 DIAGNOSIS — I10 Essential (primary) hypertension: Secondary | ICD-10-CM

## 2014-01-05 DIAGNOSIS — K560 Paralytic ileus: Secondary | ICD-10-CM | POA: Diagnosis present

## 2014-01-05 LAB — CBC
HCT: 29.3 % — ABNORMAL LOW (ref 36.0–46.0)
HCT: 29.5 % — ABNORMAL LOW (ref 36.0–46.0)
Hemoglobin: 9.4 g/dL — ABNORMAL LOW (ref 12.0–15.0)
Hemoglobin: 9.5 g/dL — ABNORMAL LOW (ref 12.0–15.0)
MCH: 29.2 pg (ref 26.0–34.0)
MCH: 29.5 pg (ref 26.0–34.0)
MCHC: 31.9 g/dL (ref 30.0–36.0)
MCHC: 32.4 g/dL (ref 30.0–36.0)
MCV: 92.5 fL (ref 78.0–100.0)
MCV: 90.2 fL (ref 78.0–100.0)
PLATELETS: 189 10*3/uL (ref 150–400)
Platelets: 211 10*3/uL (ref 150–400)
RBC: 3.19 MIL/uL — AB (ref 3.87–5.11)
RBC: 3.25 MIL/uL — ABNORMAL LOW (ref 3.87–5.11)
RDW: 14.2 % (ref 11.5–15.5)
RDW: 14.6 % (ref 11.5–15.5)
WBC: 11.7 10*3/uL — AB (ref 4.0–10.5)
WBC: 12.8 10*3/uL — ABNORMAL HIGH (ref 4.0–10.5)

## 2014-01-05 LAB — CBC WITH DIFFERENTIAL/PLATELET
Basophils Absolute: 0 10*3/uL (ref 0.0–0.1)
Basophils Relative: 0 % (ref 0–1)
Eosinophils Absolute: 0.3 10*3/uL (ref 0.0–0.7)
Eosinophils Relative: 2 % (ref 0–5)
HCT: 21.2 % — ABNORMAL LOW (ref 36.0–46.0)
Hemoglobin: 6.8 g/dL — CL (ref 12.0–15.0)
Lymphocytes Relative: 6 % — ABNORMAL LOW (ref 12–46)
Lymphs Abs: 0.8 10*3/uL (ref 0.7–4.0)
MCH: 29.3 pg (ref 26.0–34.0)
MCHC: 32.1 g/dL (ref 30.0–36.0)
MCV: 91.4 fL (ref 78.0–100.0)
Monocytes Absolute: 1 10*3/uL (ref 0.1–1.0)
Monocytes Relative: 7 % (ref 3–12)
Neutro Abs: 11 10*3/uL — ABNORMAL HIGH (ref 1.7–7.7)
Neutrophils Relative %: 84 % — ABNORMAL HIGH (ref 43–77)
Platelets: 200 10*3/uL (ref 150–400)
RBC: 2.32 MIL/uL — ABNORMAL LOW (ref 3.87–5.11)
RDW: 14 % (ref 11.5–15.5)
WBC: 13.1 10*3/uL — ABNORMAL HIGH (ref 4.0–10.5)

## 2014-01-05 LAB — BASIC METABOLIC PANEL
Anion gap: 17 — ABNORMAL HIGH (ref 5–15)
BUN: 88 mg/dL — AB (ref 6–23)
CO2: 22 mEq/L (ref 19–32)
Calcium: 7.7 mg/dL — ABNORMAL LOW (ref 8.4–10.5)
Chloride: 102 mEq/L (ref 96–112)
Creatinine, Ser: 3 mg/dL — ABNORMAL HIGH (ref 0.50–1.10)
GFR calc Af Amer: 16 mL/min — ABNORMAL LOW (ref >90)
GFR, EST NON AFRICAN AMERICAN: 13 mL/min — AB (ref >90)
Glucose, Bld: 90 mg/dL (ref 70–99)
Potassium: 4.3 mEq/L (ref 3.7–5.3)
SODIUM: 141 meq/L (ref 137–147)

## 2014-01-05 LAB — HEMOGLOBIN A1C
Hgb A1c MFr Bld: 5.1 % (ref <5.7)
Mean Plasma Glucose: 100 mg/dL (ref <117)

## 2014-01-05 LAB — COMPREHENSIVE METABOLIC PANEL
ALT: 9 U/L (ref 0–35)
AST: 11 U/L (ref 0–37)
Albumin: 1.7 g/dL — ABNORMAL LOW (ref 3.5–5.2)
Alkaline Phosphatase: 67 U/L (ref 39–117)
Anion gap: 16 — ABNORMAL HIGH (ref 5–15)
BUN: 89 mg/dL — ABNORMAL HIGH (ref 6–23)
CO2: 22 mEq/L (ref 19–32)
Calcium: 7.6 mg/dL — ABNORMAL LOW (ref 8.4–10.5)
Chloride: 106 mEq/L (ref 96–112)
Creatinine, Ser: 3.03 mg/dL — ABNORMAL HIGH (ref 0.50–1.10)
GFR calc Af Amer: 15 mL/min — ABNORMAL LOW (ref >90)
GFR calc non Af Amer: 13 mL/min — ABNORMAL LOW (ref >90)
Glucose, Bld: 73 mg/dL (ref 70–99)
Potassium: 4.6 mEq/L (ref 3.7–5.3)
Sodium: 144 mEq/L (ref 137–147)
Total Bilirubin: <0.2 mg/dL — ABNORMAL LOW (ref 0.3–1.2)
Total Protein: 4.8 g/dL — ABNORMAL LOW (ref 6.0–8.3)

## 2014-01-05 LAB — IRON AND TIBC
IRON: 29 ug/dL — AB (ref 42–135)
SATURATION RATIOS: 27 % (ref 20–55)
TIBC: 107 ug/dL — ABNORMAL LOW (ref 250–470)
UIBC: 78 ug/dL — AB (ref 125–400)

## 2014-01-05 LAB — GLUCOSE, CAPILLARY
GLUCOSE-CAPILLARY: 97 mg/dL (ref 70–99)
GLUCOSE-CAPILLARY: 95 mg/dL (ref 70–99)
Glucose-Capillary: 75 mg/dL (ref 70–99)
Glucose-Capillary: 71 mg/dL (ref 70–99)
Glucose-Capillary: 67 mg/dL — ABNORMAL LOW (ref 70–99)

## 2014-01-05 LAB — FERRITIN: Ferritin: 230 ng/mL (ref 10–291)

## 2014-01-05 LAB — RETICULOCYTES
RBC.: 2.42 MIL/uL — ABNORMAL LOW (ref 3.87–5.11)
RETIC COUNT ABSOLUTE: 55.7 10*3/uL (ref 19.0–186.0)
Retic Ct Pct: 2.3 % (ref 0.4–3.1)

## 2014-01-05 LAB — TROPONIN I
Troponin I: <0.3 ng/mL (ref <0.30)
Troponin I: <0.3 ng/mL (ref <0.30)

## 2014-01-05 LAB — LACTIC ACID, PLASMA: Lactic Acid, Venous: 0.9 mmol/L (ref 0.5–2.2)

## 2014-01-05 LAB — PREPARE RBC (CROSSMATCH)

## 2014-01-05 LAB — FOLATE: Folate: 10 ng/mL

## 2014-01-05 LAB — D-DIMER, QUANTITATIVE: D-Dimer, Quant: 2.66 ug/mL-FEU — ABNORMAL HIGH (ref 0.00–0.48)

## 2014-01-05 LAB — VITAMIN B12: Vitamin B-12: 613 pg/mL (ref 211–911)

## 2014-01-05 MED ORDER — ASPIRIN EC 81 MG PO TBEC
81.0000 mg | DELAYED_RELEASE_TABLET | Freq: Every day | ORAL | Status: DC
  Administered 2014-01-06 – 2014-01-15 (×8): 81 mg via ORAL
  Filled 2014-01-05 (×11): qty 1

## 2014-01-05 MED ORDER — INSULIN ASPART 100 UNIT/ML ~~LOC~~ SOLN
0.0000 [IU] | Freq: Three times a day (TID) | SUBCUTANEOUS | Status: DC
  Administered 2014-01-08: 2 [IU] via SUBCUTANEOUS
  Administered 2014-01-09: 3 [IU] via SUBCUTANEOUS
  Administered 2014-01-11 – 2014-01-12 (×2): 2 [IU] via SUBCUTANEOUS
  Administered 2014-01-12 – 2014-01-13 (×3): 3 [IU] via SUBCUTANEOUS
  Administered 2014-01-13: 2 [IU] via SUBCUTANEOUS
  Administered 2014-01-14: 1 [IU] via SUBCUTANEOUS
  Administered 2014-01-14: 2 [IU] via SUBCUTANEOUS
  Administered 2014-01-14: 3 [IU] via SUBCUTANEOUS
  Administered 2014-01-15 (×3): 2 [IU] via SUBCUTANEOUS

## 2014-01-05 MED ORDER — MUPIROCIN 2 % EX OINT
1.0000 "application " | TOPICAL_OINTMENT | Freq: Two times a day (BID) | CUTANEOUS | Status: AC
  Administered 2014-01-05 – 2014-01-09 (×9): 1 via NASAL
  Filled 2014-01-05 (×2): qty 22

## 2014-01-05 MED ORDER — BIOTENE DRY MOUTH MT LIQD
15.0000 mL | Freq: Two times a day (BID) | OROMUCOSAL | Status: DC
  Administered 2014-01-05 – 2014-01-15 (×20): 15 mL via OROMUCOSAL

## 2014-01-05 MED ORDER — CHLORHEXIDINE GLUCONATE CLOTH 2 % EX PADS
6.0000 | MEDICATED_PAD | Freq: Every day | CUTANEOUS | Status: AC
  Administered 2014-01-05 – 2014-01-08 (×4): 6 via TOPICAL

## 2014-01-05 MED ORDER — SODIUM CHLORIDE 0.9 % IV BOLUS (SEPSIS)
500.0000 mL | Freq: Once | INTRAVENOUS | Status: AC
  Administered 2014-01-05: 500 mL via INTRAVENOUS

## 2014-01-05 NOTE — Care Management Note (Addendum)
    Page 1 of 1   01/08/2014     10:15:46 AM CARE MANAGEMENT NOTE 01/08/2014  Patient:  Brandi [name], Brandi [name]   Account Number:  [id number]  Date Initiated:  01/05/2014  Documentation initiated by:  Elissa Hefty  Subjective/Objective Assessment:   adm w hernia, renal failure     Action/Plan:   lives alone, pcp dr a Eula Fried   Anticipated DC Date:     Anticipated DC Plan:           Choice offered to / List presented to:             Status of service:   Medicare Important Message given?  YES (If response is "NO", the following Medicare IM given date fields will be blank) Date Medicare IM given:  01/08/2014 Medicare IM given by:  Elissa Hefty Date Additional Medicare IM given:   Additional Medicare IM given by:    Discharge Disposition:    Per UR Regulation:  Reviewed for med. necessity/level of care/duration of stay  If discussed at [place] of Stay Meetings, dates discussed:   01/09/2014    Comments:

## 2014-01-05 NOTE — Progress Notes (Signed)
Pt SBP in the 80's-low 90's/50's.  Pt asymptomatic. Forrest Moron, NP notified. Orders given to give a 500cc bolus. Will continue to monitor.

## 2014-01-05 NOTE — Progress Notes (Signed)
  Subjective: Patient denies any abdominal pain or nausea Profoundly anemic - ? Etiology Moderate UOP WBC decreasing Cr stable at 3.03  Objective: Vital signs in last 24 hours: Temp:  [97.7 F (36.5 C)-97.9 F (36.6 C)] 97.9 F (36.6 C) (07/10 0723) Pulse Rate:  [72-80] 76 (07/10 0723) Resp:  [10-17] 11 (07/10 0723) BP: (90-138)/(43-94) 106/48 mmHg (07/10 0723) SpO2:  [96 %-100 %] 100 % (07/10 0824) Weight:  [347 lb (157.398 kg)] 347 lb (157.398 kg) (07/10 0316) Last BM Date: 01/04/14  Intake/Output from previous day: 07/09 0701 - 07/10 0700 In: 808.8 [I.V.:808.8] Out: 375 [Urine:375] Intake/Output this shift:    General appearance: alert, cooperative, no distress and morbidly obese GI: massively obese; palpable supraumbilical ventral hernia - reducible; non-tender  Lab Results:   Recent Labs  01/04/14 2229 01/05/14 0419  WBC 15.3* 13.1*  HGB 7.4* 6.8*  HCT 23.1* 21.2*  PLT 221 200   BMET  Recent Labs  01/04/14 2229 01/05/14 0419  NA 144 144  K 4.1 4.6  CL 104 106  CO2 23 22  GLUCOSE 89 73  BUN 91* 89*  CREATININE 3.04* 3.03*  CALCIUM 8.1* 7.6*   PT/INR No results found for this basename: LABPROT, INR,  in the last 72 hours ABG No results found for this basename: PHART, PCO2, PO2, HCO3,  in the last 72 hours  Studies/Results: Dg Abd Portable 1v  01/05/2014   CLINICAL DATA:  Bowel obstruction.  EXAM: PORTABLE ABDOMEN - 1 VIEW  COMPARISON:  CT abdomen and pelvis 09/25/2011  FINDINGS: Technically limited study due to patient body habitus. Abdomen is not entirely included within the field of view. There appears to be diffuse gaseous distention of bowel, probably colon. Appearance is most consistent with adynamic ileus. Surgical clips in the right upper quadrant.  IMPRESSION: Technically limited study. Gaseous distention of bowel most likely representing colon. Consider adynamic ileus.   Electronically Signed   By: Lucienne Capers M.D.   On: 01/05/2014  00:57    Anti-infectives: Anti-infectives   None      Assessment/Plan: s/p * No surgery found * Supraumbilical ventral hernia - contains some transverse colon, currently no sign of obstruction Patient would be at high risk for perioperative complications and certainly for recurrence of her hernia because of her massive size and her multiple medical comorbidities.  At this time, there are multiple medical issues ongoing and no indications for urgent surgery Anemia - ?acute blood loss vs. Other etiology Renal failure - volume depletion  Medical management per primary team We will follow along with you in case she develops colonic obstruction from her hernia  LOS: 1 day    Marijane Trower K. 01/05/2014

## 2014-01-05 NOTE — Progress Notes (Signed)
Brandi [name]  Brandi [name] DOB: 10/08/1928 DOA: 01/04/2014 PCP: Cleda Mccreedy, MD  Brief narrative: 80 year old morbidly obese female patient w/ a long-standing ventral hernia and recurrent abdominal pain. Patient was initially sent to [facility] from her skilled nursing facility after worsening abdominal pain over 24-48 hours. Imaging done at that time was concerning for possible incarcerated hernia. Because of her high-risk status for future surgical procedures she was refused admission by general surgery at that facility. Her case and clinical findings were discussed with general surgery at [facility] and the patient was subsequently accepted to this hospital for medical admission with surgical consultation.  After arrival she was also evaluated by general surgery. It was noted that prior to arrival patient passed a large BM and expelled significant amount of flatus with improvement in her abdominal exam with decreased tenderness. It was felt that there was no evidence of ischemia and no evidence of obstruction.  HPI/Subjective: Patient awake and endorsing generalized malaise. Still with abdominal pain that seems less than prior to arrival. Endorses thirst.  Assessment/Plan:    Incarcerated ventral hernia (transient)/ Adynamic ileus  -Appreciate surgery assistance -Still with abdominal pain but no apparent obstruction -Followup x-ray shows distended colon consistent with ileus so will allow sips of clears and monitor response -Very high risk for surgical procedure and at this time urgent surgical intervention not indicated -On chronic narcotics/Duragesic patch as well as Neurontin which could be influencing colonic motility and contributing to constipation and therefore increased risk for incarcerated hernia -Continue Colace    Dehydration -Marked elevation in BUN and creatinine in setting of symptoms of progressive abdominal  pain with nausea vomiting and poor intake for several weeks -Increase IV fluids from 75-150 cc per hour-hold home diuretics -Follow electrolytes    Acute renal failure on CKD (chronic kidney disease), stage III -Baseline BUN and creatinine dating back to 2013 24/1.73 with stability noted over the prior 4 years -Now BUN 89 with a creatinine of 3.03 -Continue IV fluids and avoid hypotension -Follow electrolytes -Avoid nephrotoxic medication     DIABETES MELLITUS -CBGs stable  -Was on Lantus preadmission -At this point follow CBGs closely and offer sliding scale insulin if needed    ANEMIA, IRON DEFICIENCY, CHRONIC -Baseline hemoglobin around 10.8 and 2013 -Since admission has drifted down to 6.8 -Check an anemia panel and transfuse 2 units packed red blood cells -Given recent incarcerated hernia fecal occult blood would likely be positive so we'll not check at this time -Continue oral iron if can tolerate    HYPERTENSION -Blood pressure soft with systolic readings now dipping down into the 90s -Hold home diuretics, carvedilol and Norvasc    Acute respiratory failure with hypoxia -Suspect mechanical issues related to recent abdominal pain and distention in setting of patient who is chronically bedbound with morbid obesity and likely chronic hypoventilation -Follow closely -Could have a degree of aspiration   Protein calorie malnutrition -Albumin level I.7 with a total protein of 4.8 -These findings could be chronic but also could be acute related to volume and protein shifting in setting of acute abdominal process/recent obstructive process    Morbid obesity    AORTIC STENOSIS, MILD    GERD   DVT prophylaxis: SCDs only for now due to unexplained anemia  Code Status: DO NOT RESUSCITATE Family Communication: No family at bedside Disposition Plan/Expected LOS: Step down  Consultants: General  surgery  Procedures: None  Cultures: None  Antibiotics:  None  Objective: Blood pressure 98/40, pulse 81, temperature 97.7 F (36.5 C), temperature source Oral, resp. rate 12, weight 347 lb (157.398 kg), SpO2 100.00%.  Intake/Output Summary (Last 24 hours) at 01/05/14 1314 Last data filed at 01/05/14 1237  Gross per 24 hour  Intake 1393.75 ml  Output    375 ml  Net 1018.75 ml   Exam: General: No acute respiratory distress Lungs: Clear to auscultation bilaterally without wheezes or crackles, 2L Cardiovascular: Regular rate and rhythm without murmur gallop or rub normal S1 and S2, no peripheral edema  Abdomen: Focally tender mid and, seems distended but difficult to evaluate in setting of large obese abdomen, soft, hypoactive bowel sounds positive, ventral hernia easily reducible ,no rebound, no ascites, no appreciable mass Musculoskeletal: No significant cyanosis, clubbing of bilateral lower extremities   Scheduled Meds:  Scheduled Meds: . antiseptic oral rinse  15 mL Mouth Rinse BID  . aspirin  325 mg Oral Daily  . budesonide-formoterol  2 puff Inhalation BID  . calcium carbonate  1 tablet Oral TID WC  . Chlorhexidine Gluconate Cloth  6 each Topical Q0600  . [START ON 01/06/2014] darbepoetin  25 mcg Subcutaneous Q14 Days  . docusate sodium  100 mg Oral BID  . [START ON 01/06/2014] fentaNYL  75 mcg Transdermal Q72H  . ferrous sulfate  325 mg Oral Q breakfast  . fluticasone  2 spray Each Nare Daily  . gabapentin  200 mg Oral BID  . guaiFENesin  600 mg Oral Q12H  . heparin  5,000 Units Subcutaneous 3 times per day  . insulin aspart  0-15 Units Subcutaneous 6 times per day  . loratadine  10 mg Oral Daily  . mupirocin ointment  1 application Nasal BID    Data Reviewed: Basic Metabolic Panel:  Recent Labs Lab 01/04/14 2229 01/05/14 0419  NA 144 144  K 4.1 4.6  CL 104 106  CO2 23 22  GLUCOSE 89 73  BUN 91* 89*  CREATININE 3.04* 3.03*  CALCIUM 8.1* 7.6*   Liver  Function Tests:  Recent Labs Lab 01/04/14 2229 01/05/14 0419  AST 11 11  ALT 11 9  ALKPHOS 69 67  BILITOT <0.2* <0.2*  PROT 5.3* 4.8*  ALBUMIN 1.8* 1.7*   CBC:  Recent Labs Lab 01/04/14 2229 01/05/14 0419  WBC 15.3* 13.1*  NEUTROABS 13.1* 11.0*  HGB 7.4* 6.8*  HCT 23.1* 21.2*  MCV 91.7 91.4  PLT 221 200   Cardiac Enzymes:  Recent Labs Lab 01/04/14 2229 01/05/14 0419 01/05/14 0800  TROPONINI <0.30 <0.30 <0.30   CBG:  Recent Labs Lab 01/04/14 2024 01/04/14 2329 01/05/14 0337 01/05/14 0723 01/05/14 1137  GLUCAP 95 89 75 71 67*    Recent Results (from the past 240 hour(s))  MRSA PCR SCREENING     Status: Abnormal   Collection Time    01/04/14  6:30 PM      Result Value Ref Range Status   MRSA by PCR POSITIVE (*) NEGATIVE Final   Comment:            The GeneXpert MRSA Assay (FDA     approved for NASAL specimens     only), is one component of a     comprehensive MRSA colonization     surveillance program. It is not     intended to diagnose MRSA     infection nor to guide or     monitor treatment for     MRSA infections.  RESULT CALLED TO, READ BACK BY AND VERIFIED WITH:     Sandrea Matte RN 2202 01/05/11 A BROWNING     Studies:  Recent x-ray studies have been reviewed in detail by the Attending Physician  Time spent :  75 mins      Erin Hearing, ANP [facility] Office  [phone] Pager [phone]   **If unable to reach the above provider after paging please contact the [place] @ [phone]  On-Call/Text Page:      [email or web address]      password [email or web address]  If 7PM-7AM, please contact night-coverage [email or web address] Password [email or web address] 01/05/2014, 1:14 PM   LOS: 1 day   I have personally examined this patient and reviewed the entire database. I have reviewed the above [name], made any necessary editorial changes, and agree with its content.  Cherene Altes, MD [facility]

## 2014-01-05 NOTE — Progress Notes (Signed)
CRITICAL VALUE ALERT  Critical value received:  Hemoglobin 6.8  Date of notification: 01/05/14  Time of notification: 0620  Critical value read back: Yes  Nurse who received alert: Leary Roca, RN  MD notified (1st page): Forrest Moron, NP  Time of first page: 0630  MD notified (2nd page):  Time of second page:  Responding MD: Forrest Moron, RN  Time MD responded: [phone]

## 2014-01-06 LAB — GLUCOSE, CAPILLARY
GLUCOSE-CAPILLARY: 97 mg/dL (ref 70–99)
Glucose-Capillary: 95 mg/dL (ref 70–99)
Glucose-Capillary: 77 mg/dL (ref 70–99)
Glucose-Capillary: 111 mg/dL — ABNORMAL HIGH (ref 70–99)
Glucose-Capillary: 99 mg/dL (ref 70–99)
Glucose-Capillary: 108 mg/dL — ABNORMAL HIGH (ref 70–99)

## 2014-01-06 LAB — TYPE AND SCREEN
ABO/RH(D): A POS
Antibody Screen: NEGATIVE
Unit division: 0
Unit division: 0

## 2014-01-06 LAB — COMPREHENSIVE METABOLIC PANEL
ALT: 10 U/L (ref 0–35)
AST: 13 U/L (ref 0–37)
Albumin: 1.6 g/dL — ABNORMAL LOW (ref 3.5–5.2)
Alkaline Phosphatase: 71 U/L (ref 39–117)
Anion gap: 16 — ABNORMAL HIGH (ref 5–15)
BUN: 86 mg/dL — ABNORMAL HIGH (ref 6–23)
CO2: 20 mEq/L (ref 19–32)
Calcium: 7.6 mg/dL — ABNORMAL LOW (ref 8.4–10.5)
Chloride: 105 mEq/L (ref 96–112)
Creatinine, Ser: 2.84 mg/dL — ABNORMAL HIGH (ref 0.50–1.10)
GFR calc Af Amer: 17 mL/min — ABNORMAL LOW (ref >90)
GFR calc non Af Amer: 14 mL/min — ABNORMAL LOW (ref >90)
Glucose, Bld: 91 mg/dL (ref 70–99)
Potassium: 4.8 mEq/L (ref 3.7–5.3)
Sodium: 141 mEq/L (ref 137–147)
Total Bilirubin: 0.2 mg/dL — ABNORMAL LOW (ref 0.3–1.2)
Total Protein: 5 g/dL — ABNORMAL LOW (ref 6.0–8.3)

## 2014-01-06 MED ORDER — VITAMIN D (ERGOCALCIFEROL) 1.25 MG (50000 UNIT) PO CAPS
50000.0000 [IU] | ORAL_CAPSULE | ORAL | Status: DC
  Administered 2014-01-06 – 2014-01-13 (×2): 50000 [IU] via ORAL
  Filled 2014-01-06 (×2): qty 1

## 2014-01-06 MED ORDER — INSULIN ASPART 100 UNIT/ML ~~LOC~~ SOLN: 0.0000 [IU] | Freq: Three times a day (TID) | SUBCUTANEOUS | Status: DC

## 2014-01-06 MED ORDER — POLYETHYLENE GLYCOL 3350 17 G PO PACK
17.0000 g | PACK | Freq: Every day | ORAL | Status: DC
  Administered 2014-01-06 – 2014-01-07 (×2): 17 g via ORAL
  Filled 2014-01-06 (×2): qty 1

## 2014-01-06 MED ORDER — WHITE PETROLATUM GEL
Status: AC
  Administered 2014-01-06: 07:00:00
  Filled 2014-01-06: qty 5

## 2014-01-06 NOTE — Progress Notes (Signed)
  Subjective: Denies abdomina pain HGB up post transfusion  Objective: Vital signs in last 24 hours: Temp:  [97.2 F (36.2 C)-98.7 F (37.1 C)] 98.6 F (37 C) (07/11 0753) Pulse Rate:  [76-85] 85 (07/11 0753) Resp:  [10-19] 13 (07/11 0753) BP: (88-126)/(40-60) 126/56 mmHg (07/11 0753) SpO2:  [98 %-100 %] 98 % (07/11 0753) Weight:  [356 lb (161.481 kg)] 356 lb (161.481 kg) (07/11 0530) Last BM Date: 01/04/14  Intake/Output from previous day: 07/10 0701 - 07/11 0700 In: 3977.5 [P.O.:960; I.V.:2000; Blood:1017.5] Out: 925 [Urine:925] Intake/Output this shift:    Abdomen soft, obese Large ventral hernia above umbilicus non tender, reducible  Lab Results:   Recent Labs  01/05/14 1934 01/05/14 2332  WBC 12.8* 11.7*  HGB 9.5* 9.4*  HCT 29.3* 29.5*  PLT 211 189   BMET  Recent Labs  01/05/14 1934 01/05/14 2332  NA 141 141  K 4.3 4.8  CL 102 105  CO2 22 20  GLUCOSE 90 91  BUN 88* 86*  CREATININE 3.00* 2.84*  CALCIUM 7.7* 7.6*   PT/INR No results found for this basename: LABPROT, INR,  in the last 72 hours ABG No results found for this basename: PHART, PCO2, PO2, HCO3,  in the last 72 hours  Studies/Results: Dg Abd Portable 1v  01/05/2014   CLINICAL DATA:  Bowel obstruction.  EXAM: PORTABLE ABDOMEN - 1 VIEW  COMPARISON:  CT abdomen and pelvis 09/25/2011  FINDINGS: Technically limited study due to patient body habitus. Abdomen is not entirely included within the field of view. There appears to be diffuse gaseous distention of bowel, probably colon. Appearance is most consistent with adynamic ileus. Surgical clips in the right upper quadrant.  IMPRESSION: Technically limited study. Gaseous distention of bowel most likely representing colon. Consider adynamic ileus.   Electronically Signed   By: Lucienne Capers M.D.   On: 01/05/2014 00:57    Anti-infectives: Anti-infectives   None      Assessment/Plan: s/p * No surgery found *  Ventral hernia  Again,  does not appear symptomatic and abdomen is benign Will see again Monday Ok for PO  LOS: 2 days    Brandi [name] A 01/06/2014

## 2014-01-06 NOTE — Progress Notes (Signed)
[facility]  Brandi [name] YQM:[id number] DOB: 1928-12-03 DOA: 01/04/2014 PCP: Cleda Mccreedy, MD  Brief narrative: 80 year old morbidly obese female patient w/ a long-standing ventral hernia and recurrent abdominal pain. Patient was initially sent to [facility] from her skilled nursing facility after worsening abdominal pain over 24-48 hours. Imaging done at that time was concerning for possible incarcerated hernia. Because of her high-risk status for future surgical procedures she was refused admission by general surgery at that facility. Her case and clinical findings were discussed with General Surgery at [facility] and the patient was subsequently accepted to this hospital for medical admission with surgical consultation.  After arrival she was also evaluated by General Surgery. It was noted that prior to arrival patient passed a large BM and expelled significant amount of flatus with improvement in her abdominal exam with decreased tenderness. It was felt that there was no evidence of ischemia and no evidence of obstruction.  HPI/Subjective: Pt is somewhat lethargic, but is able to answer basic questions.  She denies cp or sob.  She reports some moderate abdom pain, but says her abdom is feeling better.    Assessment/Plan:  Incarcerated ventral hernia (transient)/ Adynamic ileus  No further evidence of incarceration, but illeus appears to be slow to improve - cont to stimulate bowels - avoid narcotics - f/u KUB in AM - keep on clears only for now   Dehydration Clinically resolved s/p IVF and PRBC resuscitation - cont maintenance fluids until intake improves   Acute renal failure on Chronic kidney disease, stage III Baseline BUN and creatinine dating back to 2013 24/1.73 - continue IV fluids and avoid hypotension - avoid nephrotoxic medication   DIABETES MELLITUS CBGs stable   ANEMIA, IRON DEFICIENCY, CHRONIC Baseline hemoglobin around  10.8 as of 2013 - after admission Hgb drifted down to 6.8 - transfused 2 units packed red blood cells - Hgb stable for now - follow   Hx of HYPERTENSION Blood pressure improved, but remains borderline - hold home BP meds  Acute respiratory failure with hypoxia Suspect mechanical issues related to recent abdominal pain and distention in setting of patient who is chronically bedbound with morbid obesity and likely chronic hypoventilation - likely has some chronic hypercarbia due to OHS/SA - BIPAP if lethargy progresses   Protein calorie malnutrition Albumin level I.7 with a total protein of 4.8  Morbid obesity - Body mass index is 61.08 kg/(m^2).  AORTIC STENOSIS, MILD  GERD  DVT prophylaxis: resume heparin and follow  Code Status: DO NOT RESUSCITATE Family Communication: No family at bedside Disposition Plan/Expected LOS: Step down  Consultants: General Surgery  Procedures: None  Cultures: None  Antibiotics: None  Objective: Blood pressure 118/55, pulse 83, temperature 98.4 F (36.9 C), temperature source Oral, resp. rate 15, height 5\' 4"  (1.626 m), weight 161.481 kg (356 lb), SpO2 100.00%.  Intake/Output Summary (Last 24 hours) at 01/06/14 1409 Last data filed at 01/06/14 1003  Gross per 24 hour  Intake 3152.5 ml  Output    925 ml  Net 2227.5 ml   Exam: General: No acute respiratory distress Lungs: Clear to auscultation bilaterally without wheezes or crackles but bs very distant  Cardiovascular: Regular rate and rhythm without gallop or rub - 2/6 holosystolic M  Abdomen: mobridly obese, ventral herniation appreciated but is soft to palpation - mild pain to deep palpation th/o - bs hypoactive but present - no rebound  Musculoskeletal: No significant cyanosis, clubbing of bilateral  lower extremities - 2+ B LE edema   Scheduled Meds:  Scheduled Meds: . antiseptic oral rinse  15 mL Mouth Rinse BID  . aspirin EC  81 mg Oral Daily  . budesonide-formoterol  2 puff  Inhalation BID  . calcium carbonate  1 tablet Oral TID WC  . Chlorhexidine Gluconate Cloth  6 each Topical Q0600  . darbepoetin  25 mcg Subcutaneous Q14 Days  . docusate sodium  100 mg Oral BID  . fentaNYL  75 mcg Transdermal Q72H  . ferrous sulfate  325 mg Oral Q breakfast  . fluticasone  2 spray Each Nare Daily  . gabapentin  200 mg Oral BID  . guaiFENesin  600 mg Oral Q12H  . insulin aspart  0-15 Units Subcutaneous TID WC  . loratadine  10 mg Oral Daily  . mupirocin ointment  1 application Nasal BID    Data Reviewed: Basic Metabolic Panel:  Recent Labs Lab 01/04/14 2229 01/05/14 0419 01/05/14 1934 01/05/14 2332  NA 144 144 141 141  K 4.1 4.6 4.3 4.8  CL 104 106 102 105  CO2 23 22 22 20   GLUCOSE 89 73 90 91  BUN 91* 89* 88* 86*  CREATININE 3.04* 3.03* 3.00* 2.84*  CALCIUM 8.1* 7.6* 7.7* 7.6*   Liver Function Tests:  Recent Labs Lab 01/04/14 2229 01/05/14 0419 01/05/14 2332  AST 11 11 13   ALT 11 9 10   ALKPHOS 69 67 71  BILITOT <0.2* <0.2* 0.2*  PROT 5.3* 4.8* 5.0*  ALBUMIN 1.8* 1.7* 1.6*   CBC:  Recent Labs Lab 01/04/14 2229 01/05/14 0419 01/05/14 1934 01/05/14 2332  WBC 15.3* 13.1* 12.8* 11.7*  NEUTROABS 13.1* 11.0*  --   --   HGB 7.4* 6.8* 9.5* 9.4*  HCT 23.1* 21.2* 29.3* 29.5*  MCV 91.7 91.4 90.2 92.5  PLT 221 200 211 189   Cardiac Enzymes:  Recent Labs Lab 01/04/14 2229 01/05/14 0419 01/05/14 0800  TROPONINI <0.30 <0.30 <0.30   CBG:  Recent Labs Lab 01/05/14 2019 01/06/14 0017 01/06/14 0435 01/06/14 0753 01/06/14 1149  GLUCAP 95 97 95 77 111*    Recent Results (from the past 240 hour(s))  MRSA PCR SCREENING     Status: Abnormal   Collection Time    01/04/14  6:30 PM      Result Value Ref Range Status   MRSA by PCR POSITIVE (*) NEGATIVE Final   Comment:            The GeneXpert MRSA Assay (FDA     approved for NASAL specimens     only), is one component of a     comprehensive MRSA colonization     surveillance program.  It is not     intended to diagnose MRSA     infection nor to guide or     monitor treatment for     MRSA infections.     RESULT CALLED TO, READ BACK BY AND VERIFIED WITH:     Sandrea Matte RN 2202 01/05/11 A BROWNING    Studies:  Recent x-ray studies have been reviewed in detail by the Attending Physician  Time spent :  73 mins  Cherene Altes, MD [facility] For Consults/Admissions - Flow Manager - [phone] Office  [phone] Pager [phone]  On-Call/Text Page:      [email or web address]      password [facility]  01/06/2014, 2:09 PM   LOS: 2 days

## 2014-01-07 ENCOUNTER — Inpatient Hospital Stay (HOSPITAL_COMMUNITY): Payer: PRIVATE HEALTH INSURANCE

## 2014-01-07 LAB — CBC
HEMATOCRIT: 29 % — AB (ref 36.0–46.0)
Hemoglobin: 9.2 g/dL — ABNORMAL LOW (ref 12.0–15.0)
MCH: 29.1 pg (ref 26.0–34.0)
MCHC: 31.7 g/dL (ref 30.0–36.0)
MCV: 91.8 fL (ref 78.0–100.0)
Platelets: 231 10*3/uL (ref 150–400)
RBC: 3.16 MIL/uL — ABNORMAL LOW (ref 3.87–5.11)
RDW: 15.1 % (ref 11.5–15.5)
WBC: 9.6 10*3/uL (ref 4.0–10.5)

## 2014-01-07 LAB — COMPREHENSIVE METABOLIC PANEL
ALBUMIN: 1.8 g/dL — AB (ref 3.5–5.2)
ALK PHOS: 77 U/L (ref 39–117)
ALT: 10 U/L (ref 0–35)
ANION GAP: 16 — AB (ref 5–15)
AST: 22 U/L (ref 0–37)
BILIRUBIN TOTAL: 0.2 mg/dL — AB (ref 0.3–1.2)
BUN: 82 mg/dL — ABNORMAL HIGH (ref 6–23)
CO2: 20 meq/L (ref 19–32)
Calcium: 8 mg/dL — ABNORMAL LOW (ref 8.4–10.5)
Chloride: 107 mEq/L (ref 96–112)
Creatinine, Ser: 2.86 mg/dL — ABNORMAL HIGH (ref 0.50–1.10)
GFR calc Af Amer: 16 mL/min — ABNORMAL LOW (ref >90)
GFR, EST NON AFRICAN AMERICAN: 14 mL/min — AB (ref >90)
Glucose, Bld: 99 mg/dL (ref 70–99)
POTASSIUM: 5.7 meq/L — AB (ref 3.7–5.3)
Sodium: 143 mEq/L (ref 137–147)
Total Protein: 5.3 g/dL — ABNORMAL LOW (ref 6.0–8.3)

## 2014-01-07 LAB — GLUCOSE, CAPILLARY
GLUCOSE-CAPILLARY: 117 mg/dL — AB (ref 70–99)
GLUCOSE-CAPILLARY: 117 mg/dL — AB (ref 70–99)
Glucose-Capillary: 106 mg/dL — ABNORMAL HIGH (ref 70–99)
Glucose-Capillary: 119 mg/dL — ABNORMAL HIGH (ref 70–99)

## 2014-01-07 MED ORDER — HEPARIN SODIUM (PORCINE) 5000 UNIT/ML IJ SOLN
5000.0000 [IU] | Freq: Three times a day (TID) | INTRAMUSCULAR | Status: DC
  Administered 2014-01-07 – 2014-01-15 (×22): 5000 [IU] via SUBCUTANEOUS
  Filled 2014-01-07 (×31): qty 1

## 2014-01-07 MED ORDER — SORBITOL 70 % SOLN
30.0000 mL | Freq: Every day | Status: DC
  Administered 2014-01-07 – 2014-01-08 (×2): 30 mL via ORAL
  Filled 2014-01-07 (×3): qty 30

## 2014-01-07 MED ORDER — POLYETHYLENE GLYCOL 3350 17 G PO PACK
17.0000 g | PACK | Freq: Two times a day (BID) | ORAL | Status: DC
  Administered 2014-01-07 – 2014-01-09 (×4): 17 g via ORAL
  Filled 2014-01-07 (×9): qty 1

## 2014-01-07 MED ORDER — ACETAMINOPHEN 325 MG PO TABS: 650.0000 mg | ORAL_TABLET | Freq: Four times a day (QID) | ORAL | Status: DC | PRN

## 2014-01-07 MED ORDER — FENTANYL 50 MCG/HR TD PT72
50.0000 ug | MEDICATED_PATCH | TRANSDERMAL | Status: DC
  Administered 2014-01-12 – 2014-01-15 (×2): 50 ug via TRANSDERMAL
  Filled 2014-01-07 (×3): qty 1

## 2014-01-07 NOTE — Progress Notes (Signed)
[facility]  Brandi [name] GMW:[id number] DOB: December 05, 1928 DOA: 01/04/2014 PCP: Cleda Mccreedy, MD  Brief narrative: 80 year old morbidly obese female patient w/ a long-standing ventral hernia and recurrent abdominal pain. Patient was initially sent to [facility] from her skilled nursing facility after worsening abdominal pain over 24-48 hours. Imaging done at that time was concerning for possible incarcerated hernia. Because of her high-risk status for future surgical procedures she was refused admission by general surgery at that facility. Her case and clinical findings were discussed with General Surgery at [facility] and the patient was subsequently accepted to this hospital for medical admission with surgical consultation.  After arrival she was also evaluated by General Surgery. It was noted that prior to arrival the patient passed a large BM and expelled significant amount of flatus with improvement in her abdominal exam with decreased tenderness. It was felt that there was no evidence of ischemia and no evidence of obstruction.  HPI/Subjective: Pt is tolerting her full liquid diet thus far.  She has no new complaints today.  She has not had a BM nor has she been passing gas.    Assessment/Plan:  Incarcerated ventral hernia (transient)/ Adynamic ileus  No further evidence of incarceration, but illeus appears to be slow to improve - cont to stimulate bowels w/ adjustment in med tx - further minimize narcotics - f/u KUB w/o signif change - will not advance diet further at this time    Dehydration Clinically resolved s/p IVF and PRBC resuscitation - cont maintenance fluids until intake improves   Acute renal failure on Chronic kidney disease, stage III Baseline BUN and creatinine dating back to 2013 24/1.73 - continue IV fluids and avoid hypotension - avoid nephrotoxic medication   DIABETES MELLITUS CBGs stable   ANEMIA, IRON DEFICIENCY,  CHRONIC Baseline hemoglobin around 10.8 as of 2013 - after admission Hgb drifted down to 6.8 - transfused 2 units packed red blood cells - Hgb stable for now - cont to follow   Hx of HYPERTENSION Blood pressure now stable - follow w/o change in tx plan today  Acute respiratory failure with hypoxia Suspect mechanical issues related to recent abdominal pain and distention in setting of patient who is chronically bedbound with morbid obesity and likely chronic hypoventilation - likely has some chronic hypercarbia due to OHS/SA - BIPAP if lethargy progresses   Protein calorie malnutrition Albumin level I.7 with a total protein of 4.8  Morbid obesity - Body mass index is 61.08 kg/(m^2).  AORTIC STENOSIS, MILD  GERD  DVT prophylaxis: SQ heparin  Code Status: DO NOT RESUSCITATE Family Communication: No family at bedside Disposition Plan/Expected LOS: Step down  Consultants: General Surgery  Procedures: None  Cultures: None  Antibiotics: None  Objective: Blood pressure 126/59, pulse 86, temperature 98.1 F (36.7 C), temperature source Oral, resp. rate 15, height 5\' 4"  (1.626 m), weight 161.481 kg (356 lb), SpO2 97.00%.  Intake/Output Summary (Last 24 hours) at 01/07/14 1407 Last data filed at 01/07/14 1300  Gross per 24 hour  Intake 2619.58 ml  Output   2250 ml  Net 369.58 ml   Exam: General: No acute respiratory distress Lungs: Clear to auscultation bilaterally without wheezes or crackles but bs very distant  Cardiovascular: distant heart sounds - regular rate and rhythm without gallop or rub - 2/6 holosystolic M  Abdomen: mobridly obese, ventral herniation appreciated but is soft to palpation - mild pain to deep palpation th/o - bs hypoactive  but present - no rebound  Musculoskeletal: No significant cyanosis, clubbing of bilateral lower extremities - 2+ B LE edema   Scheduled Meds:  Scheduled Meds: . antiseptic oral rinse  15 mL Mouth Rinse BID  . aspirin EC  81 mg  Oral Daily  . budesonide-formoterol  2 puff Inhalation BID  . calcium carbonate  1 tablet Oral TID WC  . Chlorhexidine Gluconate Cloth  6 each Topical Q0600  . darbepoetin  25 mcg Subcutaneous Q14 Days  . docusate sodium  100 mg Oral BID  . fentaNYL  75 mcg Transdermal Q72H  . ferrous sulfate  325 mg Oral Q breakfast  . fluticasone  2 spray Each Nare Daily  . guaiFENesin  600 mg Oral Q12H  . insulin aspart  0-15 Units Subcutaneous TID WC  . loratadine  10 mg Oral Daily  . mupirocin ointment  1 application Nasal BID  . polyethylene glycol  17 g Oral Daily  . Vitamin D (Ergocalciferol)  50,000 Units Oral Q7 days    Data Reviewed: Basic Metabolic Panel:  Recent Labs Lab 01/04/14 2229 01/05/14 0419 01/05/14 1934 01/05/14 2332 01/07/14 0355  NA 144 144 141 141 143  K 4.1 4.6 4.3 4.8 5.7*  CL 104 106 102 105 107  CO2 23 22 22 20 20   GLUCOSE 89 73 90 91 99  BUN 91* 89* 88* 86* 82*  CREATININE 3.04* 3.03* 3.00* 2.84* 2.86*  CALCIUM 8.1* 7.6* 7.7* 7.6* 8.0*   Liver Function Tests:  Recent Labs Lab 01/04/14 2229 01/05/14 0419 01/05/14 2332 01/07/14 0355  AST 11 11 13 22   ALT 11 9 10 10   ALKPHOS 69 67 71 77  BILITOT <0.2* <0.2* 0.2* 0.2*  PROT 5.3* 4.8* 5.0* 5.3*  ALBUMIN 1.8* 1.7* 1.6* 1.8*   CBC:  Recent Labs Lab 01/04/14 2229 01/05/14 0419 01/05/14 1934 01/05/14 2332 01/07/14 0355  WBC 15.3* 13.1* 12.8* 11.7* 9.6  NEUTROABS 13.1* 11.0*  --   --   --   HGB 7.4* 6.8* 9.5* 9.4* 9.2*  HCT 23.1* 21.2* 29.3* 29.5* 29.0*  MCV 91.7 91.4 90.2 92.5 91.8  PLT 221 200 211 189 231   Cardiac Enzymes:  Recent Labs Lab 01/04/14 2229 01/05/14 0419 01/05/14 0800  TROPONINI <0.30 <0.30 <0.30   CBG:  Recent Labs Lab 01/06/14 1149 01/06/14 1611 01/06/14 2130 01/07/14 0813 01/07/14 1208  GLUCAP 111* 99 108* 106* 119*    Recent Results (from the past 240 hour(s))  MRSA PCR SCREENING     Status: Abnormal   Collection Time    01/04/14  6:30 PM      Result  Value Ref Range Status   MRSA by PCR POSITIVE (*) NEGATIVE Final   Comment:            The GeneXpert MRSA Assay (FDA     approved for NASAL specimens     only), is one component of a     comprehensive MRSA colonization     surveillance program. It is not     intended to diagnose MRSA     infection nor to guide or     monitor treatment for     MRSA infections.     RESULT CALLED TO, READ BACK BY AND VERIFIED WITH:     Sandrea Matte RN 2202 01/05/11 A BROWNING    Studies:  Recent x-ray studies have been reviewed in detail by the Attending Physician  Time spent :  25 mins  Cherene Altes,  MD [facility] For Consults/Admissions - Flow Manager - [phone] Office  [phone] Pager [phone]  On-Call/Text Page:      [email or web address]      password [facility]  01/07/2014, 2:07 PM   LOS: 3 days

## 2014-01-08 DIAGNOSIS — E860 Dehydration: Secondary | ICD-10-CM

## 2014-01-08 LAB — GLUCOSE, CAPILLARY
GLUCOSE-CAPILLARY: 123 mg/dL — AB (ref 70–99)
Glucose-Capillary: 106 mg/dL — ABNORMAL HIGH (ref 70–99)
Glucose-Capillary: 104 mg/dL — ABNORMAL HIGH (ref 70–99)
Glucose-Capillary: 139 mg/dL — ABNORMAL HIGH (ref 70–99)

## 2014-01-08 LAB — BASIC METABOLIC PANEL
Anion gap: 17 — ABNORMAL HIGH (ref 5–15)
BUN: 80 mg/dL — AB (ref 6–23)
CALCIUM: 8.4 mg/dL (ref 8.4–10.5)
CO2: 20 mEq/L (ref 19–32)
Chloride: 107 mEq/L (ref 96–112)
Creatinine, Ser: 2.64 mg/dL — ABNORMAL HIGH (ref 0.50–1.10)
GFR calc Af Amer: 18 mL/min — ABNORMAL LOW (ref >90)
GFR, EST NON AFRICAN AMERICAN: 16 mL/min — AB (ref >90)
Glucose, Bld: 115 mg/dL — ABNORMAL HIGH (ref 70–99)
Potassium: 4.9 mEq/L (ref 3.7–5.3)
SODIUM: 144 meq/L (ref 137–147)

## 2014-01-08 LAB — CBC
HEMATOCRIT: 31.4 % — AB (ref 36.0–46.0)
Hemoglobin: 9.9 g/dL — ABNORMAL LOW (ref 12.0–15.0)
MCH: 28.9 pg (ref 26.0–34.0)
MCHC: 31.5 g/dL (ref 30.0–36.0)
MCV: 91.8 fL (ref 78.0–100.0)
Platelets: 256 10*3/uL (ref 150–400)
RBC: 3.42 MIL/uL — AB (ref 3.87–5.11)
RDW: 15 % (ref 11.5–15.5)
WBC: 9.6 10*3/uL (ref 4.0–10.5)

## 2014-01-08 MED ORDER — PHENOL 1.4 % MT LIQD
1.0000 | OROMUCOSAL | Status: DC | PRN
  Filled 2014-01-08: qty 177

## 2014-01-08 MED ORDER — GLUCERNA SHAKE PO LIQD
237.0000 mL | Freq: Three times a day (TID) | ORAL | Status: DC
  Administered 2014-01-08: 237 mL via ORAL

## 2014-01-08 NOTE — Progress Notes (Signed)
  Subjective: Denies nausea Does have a small amount of abdominal pain  Objective: Vital signs in last 24 hours: Temp:  [97.8 F (36.6 C)-99.2 F (37.3 C)] 98.8 F (37.1 C) (07/13 0400) Pulse Rate:  [80-91] 87 (07/13 0600) Resp:  [9-19] 11 (07/13 0600) BP: (106-155)/(51-105) 143/60 mmHg (07/13 0600) SpO2:  [92 %-100 %] 98 % (07/13 0600) Last BM Date: 01/04/14  Intake/Output from previous day: 07/12 0701 - 07/13 0700 In: 1106 [I.V.:1106] Out: 1550 [Urine:1550] Intake/Output this shift:    Morbidly obese abdomen Ventral hernia mildly tender today, but reducible  Lab Results:   Recent Labs  01/07/14 0355 01/08/14 0005  WBC 9.6 9.6  HGB 9.2* 9.9*  HCT 29.0* 31.4*  PLT 231 256   BMET  Recent Labs  01/07/14 0355 01/08/14 0005  NA 143 144  K 5.7* 4.9  CL 107 107  CO2 20 20  GLUCOSE 99 115*  BUN 82* 80*  CREATININE 2.86* 2.64*  CALCIUM 8.0* 8.4   PT/INR No results found for this basename: LABPROT, INR,  in the last 72 hours ABG No results found for this basename: PHART, PCO2, PO2, HCO3,  in the last 72 hours  Studies/Results: Dg Abd Portable 1v  01/07/2014   CLINICAL DATA:  Evaluate bowel gas pattern.  EXAM: PORTABLE ABDOMEN - 1 VIEW  COMPARISON:  01/04/2014; CT abdomen pelvis - 09/25/2011.  FINDINGS: The exam in is markedly degraded due to patient body habitus and portable technique.  There is mild to moderate gas distention of predominantly the colon, unchanged minimally improved in the interval. There is no definitive gas distention of the small bowel. Nondiagnostic evaluation for pneumoperitoneum secondary supine positioning and patient respiratory artifact degrading evaluation of the lower thorax. No definite pneumatosis or portal venous gas. Multiple surgical clips overlie the right upper abdomen. Punctate calcification overlying the medial aspect of the right mid abdomen likely represents the gonadal vein phlebolith demonstrated on prior abdominal CT.   IMPRESSION: Degraded examination with no change to minimally improved appearance of suspected ileus.   Electronically Signed   By: Sandi Mariscal M.D.   On: 01/07/2014 12:42    Anti-infectives: Anti-infectives   None      Assessment/Plan:  Ventral hernia  No plans for surgery unless she becomes acutely ill/obstructed. Continue current care  LOS: 4 days    Cleveland Paiz A 01/08/2014

## 2014-01-08 NOTE — Progress Notes (Signed)
Pt complains of sore throat. Poor intake. Hypoactive bowel sounds. No BM since 7/9. Daughter will visit this pm.

## 2014-01-08 NOTE — Progress Notes (Signed)
[facility]  Brandi [name]:[id number] DOB: 12-30-1928 DOA: 01/04/2014 PCP: Cleda Mccreedy, MD  Brief narrative: 80 year old morbidly obese female patient w/ a long-standing ventral hernia and recurrent abdominal pain. Patient was initially sent to [facility] from her skilled nursing facility after worsening abdominal pain over 24-48 hours. Imaging done at that time was concerning for possible incarcerated hernia. Because of her high-risk status for future surgical procedures she was refused admission by general surgery at that facility. Her case and clinical findings were discussed with General Surgery at [facility] and the patient was subsequently accepted to this hospital for medical admission with surgical consultation.  After arrival she was also evaluated by General Surgery. It was noted that prior to arrival the patient passed a large BM and expelled significant amount of flatus with improvement in her abdominal exam with decreased tenderness. It was felt that there was no evidence of ischemia and no evidence of obstruction.  HPI/Subjective: Not tolerating fulls - endorses cont'd abdominal pain and denies passage of stool  Assessment/Plan:  Incarcerated ventral hernia (transient)/ Adynamic ileus  No further evidence of incarceration and illeus slow to improve - cont to stimulate bowels w/ adjustment in med tx - further minimize narcotics - f/u KUB w/o signif change - will not advance diet further and given poor PO intake in setting of persistent ARF will increase IVFs  Dehydration Resolved (see above)  Acute renal failure on Chronic kidney disease, stage III Baseline BUN and creatinine dating back to 2013 24/1.73 - avoid hypotension - avoid nephrotoxic medication   DIABETES MELLITUS CBGs stable   ANEMIA, IRON DEFICIENCY, CHRONIC Baseline hemoglobin around 10.8 as of 2013 - after admission Hgb drifted down to 6.8 - transfused 2 units  packed red blood cells - Hgb stable for now - cont to follow   Hx of HYPERTENSION Blood pressure now elevated but w/ recurrent GO sx's and suspected slow transit time will hold off on PO meds - consider IV alternative if BP increases further  Acute respiratory failure with hypoxia Suspect mechanical issues related to recent abdominal pain and distention in setting of patient who is chronically bedbound with morbid obesity and likely chronic hypoventilation - suspect underlying chronic hypercarbia due to OHS/SA   Protein calorie malnutrition Albumin level I.7 with a total protein of 4.8  Morbid obesity - Body mass index is 61.08 kg/(m^2).  AORTIC STENOSIS, MILD  GERD  DVT prophylaxis: SQ heparin  Code Status: DO NOT RESUSCITATE Family Communication: No family at bedside Disposition Plan/Expected LOS: Transfer to floor  Consultants: General Surgery  Procedures: None  Cultures: None  Antibiotics: None  Objective: Blood pressure 156/64, pulse 88, temperature 98.6 F (37 C), temperature source Oral, resp. rate 17, height 5\' 4"  (1.626 m), weight 356 lb (161.481 kg), SpO2 91.00%.  Intake/Output Summary (Last 24 hours) at 01/08/14 1308 Last data filed at 01/08/14 1300  Gross per 24 hour  Intake   1625 ml  Output   1825 ml  Net   -200 ml   Exam: General: No acute respiratory distress Lungs: Clear to auscultation bilaterally without wheezes or crackles but bs very distant - RA Cardiovascular: distant heart sounds - regular rate and rhythm  - 2/6 holosystolic Murmur  Abdomen: mobridly obese, ventral herniation appreciated but is soft to palpation and non tender - mild diffuse pain to deep palpation - bs hypoactive but present - no rebound  Musculoskeletal: No significant cyanosis, clubbing of bilateral  lower extremities - 2+ B LE edema   Scheduled Meds:  Scheduled Meds: . antiseptic oral rinse  15 mL Mouth Rinse BID  . aspirin EC  81 mg Oral Daily  . budesonide-formoterol   2 puff Inhalation BID  . calcium carbonate  1 tablet Oral TID WC  . Chlorhexidine Gluconate Cloth  6 each Topical Q0600  . darbepoetin  25 mcg Subcutaneous Q14 Days  . docusate sodium  100 mg Oral BID  . [START ON 01/09/2014] fentaNYL  50 mcg Transdermal Q72H  . fluticasone  2 spray Each Nare Daily  . heparin subcutaneous  5,000 Units Subcutaneous 3 times per day  . insulin aspart  0-15 Units Subcutaneous TID WC  . loratadine  10 mg Oral Daily  . mupirocin ointment  1 application Nasal BID  . polyethylene glycol  17 g Oral BID  . sorbitol  30 mL Oral Daily  . Vitamin D (Ergocalciferol)  50,000 Units Oral Q7 days    Data Reviewed: Basic Metabolic Panel:  Recent Labs Lab 01/05/14 0419 01/05/14 1934 01/05/14 2332 01/07/14 0355 01/08/14 0005  NA 144 141 141 143 144  K 4.6 4.3 4.8 5.7* 4.9  CL 106 102 105 107 107  CO2 22 22 20 20 20   GLUCOSE 73 90 91 99 115*  BUN 89* 88* 86* 82* 80*  CREATININE 3.03* 3.00* 2.84* 2.86* 2.64*  CALCIUM 7.6* 7.7* 7.6* 8.0* 8.4   Liver Function Tests:  Recent Labs Lab 01/04/14 2229 01/05/14 0419 01/05/14 2332 01/07/14 0355  AST 11 11 13 22   ALT 11 9 10 10   ALKPHOS 69 67 71 77  BILITOT <0.2* <0.2* 0.2* 0.2*  PROT 5.3* 4.8* 5.0* 5.3*  ALBUMIN 1.8* 1.7* 1.6* 1.8*   CBC:  Recent Labs Lab 01/04/14 2229 01/05/14 0419 01/05/14 1934 01/05/14 2332 01/07/14 0355 01/08/14 0005  WBC 15.3* 13.1* 12.8* 11.7* 9.6 9.6  NEUTROABS 13.1* 11.0*  --   --   --   --   HGB 7.4* 6.8* 9.5* 9.4* 9.2* 9.9*  HCT 23.1* 21.2* 29.3* 29.5* 29.0* 31.4*  MCV 91.7 91.4 90.2 92.5 91.8 91.8  PLT 221 200 211 189 231 256   Cardiac Enzymes:  Recent Labs Lab 01/04/14 2229 01/05/14 0419 01/05/14 0800  TROPONINI <0.30 <0.30 <0.30   CBG:  Recent Labs Lab 01/07/14 1208 01/07/14 1648 01/07/14 2118 01/08/14 0830 01/08/14 1220  GLUCAP 119* 117* 117* 106* 123*    Recent Results (from the past 240 hour(s))  MRSA PCR SCREENING     Status: Abnormal    Collection Time    01/04/14  6:30 PM      Result Value Ref Range Status   MRSA by PCR POSITIVE (*) NEGATIVE Final   Comment:            The GeneXpert MRSA Assay (FDA     approved for NASAL specimens     only), is one component of a     comprehensive MRSA colonization     surveillance program. It is not     intended to diagnose MRSA     infection nor to guide or     monitor treatment for     MRSA infections.     RESULT CALLED TO, READ BACK BY AND VERIFIED WITH:     Sandrea Matte RN 2202 01/05/11 A BROWNING    Studies:  Recent x-ray studies have been reviewed in detail by the Attending Physician  Time spent :  35 mins  Ebony Hail  Lissa Merlin, ANP [facility] For Consults/Admissions - Flow Manager - [phone] Office  [phone] Pager [phone]  On-Call/Text Page:      [email or web address]      password [facility]  01/08/2014, 1:08 PM   LOS: 4 days   I have personally examined this patient and reviewed the entire database. I have reviewed the above note, made any necessary editorial changes, and agree with its content.  Cherene Altes, MD [facility]

## 2014-01-08 NOTE — Progress Notes (Signed)
INITIAL NUTRITION ASSESSMENT  DOCUMENTATION CODES Per approved criteria  -Morbid Obesity   INTERVENTION: -Glucerna Shakes TID, each provides 220 kcal, 10 g protein -Patient requires full assistance eating meals.  -Advance diet as tolerated per MD.  NUTRITION DIAGNOSIS: Inadequate oral intake related to abdominal pain as evidenced by limited intake of full liquids.   Goal: Patient will meet >/=90% of estimated nutrition needs  Monitor:  Diet advancement, PO intake, weight, albs  Reason for Assessment: Low Braden  80 y.o. female  Admitting Dx: Abdominal pain  ASSESSMENT: 80 y.o. year old female multiple medical problems including morbid obesity, bed bound status, HTN, DM, CKD, HLD, ventral hernia presenting with abd pain, Incarcerated ventral hernia. Pt is a SNF resident near [facility]. Per report from grandson, patient has had recurrent abdominal pain over the past several months related to a ventral hernia. She had acute worsening of abdominal pain over the past 24-48 hours.   -Patient with incarcerated ventral hernia and slow to improve ileus. She is currently on Full liquids. No plans to advance diet due to no change in ileus. No plans for surgery unless acutely ill or obstructed. -Patient is not eating much related to abdominal pain and lack of appetite. She does report to me that she is a little hungry. She requires full assistance with meals.    Height: Ht Readings from Last 1 Encounters:  01/05/14 5\' 4"  (1.626 m)    Weight: Wt Readings from Last 1 Encounters:  01/06/14 356 lb (161.481 kg)    Ideal Body Weight: 120 pounds  % Ideal Body Weight: 297%  Wt Readings from Last 10 Encounters:  01/06/14 356 lb (161.481 kg)  10/16/11 359 lb (162.841 kg)    Usual Body Weight: 360 pounds  % Usual Body Weight: 99%  BMI:  Body mass index is 61.08 kg/(m^2). Patient is morbidly obese.   Estimated Nutritional Needs: Kcal: 2050-2250 kcal Protein: 115-125  g Fluid: >2.9 L/day  Skin: Intact  Diet Order: Full Liquid  EDUCATION NEEDS: -No education needs identified at this time   Intake/Output Summary (Last 24 hours) at 01/08/14 1511 Last data filed at 01/08/14 1300  Gross per 24 hour  Intake   1550 ml  Output   1825 ml  Net   -275 ml    Last BM: 7/9   Labs:   Recent Labs Lab 01/05/14 2332 01/07/14 0355 01/08/14 0005  NA 141 143 144  K 4.8 5.7* 4.9  CL 105 107 107  CO2 20 20 20   BUN 86* 82* 80*  CREATININE 2.84* 2.86* 2.64*  CALCIUM 7.6* 8.0* 8.4  GLUCOSE 91 99 115*    CBG (last 3)   Recent Labs  01/07/14 2118 01/08/14 0830 01/08/14 1220  GLUCAP 117* 106* 123*    Scheduled Meds: . antiseptic oral rinse  15 mL Mouth Rinse BID  . aspirin EC  81 mg Oral Daily  . budesonide-formoterol  2 puff Inhalation BID  . calcium carbonate  1 tablet Oral TID WC  . Chlorhexidine Gluconate Cloth  6 each Topical Q0600  . darbepoetin  25 mcg Subcutaneous Q14 Days  . docusate sodium  100 mg Oral BID  . [START ON 01/09/2014] fentaNYL  50 mcg Transdermal Q72H  . fluticasone  2 spray Each Nare Daily  . heparin subcutaneous  5,000 Units Subcutaneous 3 times per day  . insulin aspart  0-15 Units Subcutaneous TID WC  . loratadine  10 mg Oral Daily  . mupirocin ointment  1  application Nasal BID  . polyethylene glycol  17 g Oral BID  . sorbitol  30 mL Oral Daily  . Vitamin D (Ergocalciferol)  50,000 Units Oral Q7 days    Continuous Infusions: . sodium chloride 60 mL/hr (01/08/14 0847)    Past Medical History  Diagnosis Date  . CHF (congestive heart failure)   . COPD (chronic obstructive pulmonary disease)   . Hypertension   . Diabetes mellitus   . Renal disorder   . Schmorl's nodes   . Muscle weakness   . Chronic pain   . GERD (gastroesophageal reflux disease)   . Chronic bronchitis   . Hypoxemia   . Depression   . Anemia   . Morbid obesity   . Shortness of breath   . DVT (deep venous thrombosis)     Past  Surgical History  Procedure Laterality Date  . Cholecystectomy    . Abdominal hysterectomy      Larey Seat, RD, LDN Pager #: [phone] After-Hours Pager #: [phone]

## 2014-01-09 ENCOUNTER — Inpatient Hospital Stay (HOSPITAL_COMMUNITY): Payer: PRIVATE HEALTH INSURANCE

## 2014-01-09 DIAGNOSIS — N179 Acute kidney failure, unspecified: Secondary | ICD-10-CM

## 2014-01-09 DIAGNOSIS — K560 Paralytic ileus: Secondary | ICD-10-CM

## 2014-01-09 LAB — GLUCOSE, CAPILLARY
GLUCOSE-CAPILLARY: 145 mg/dL — AB (ref 70–99)
GLUCOSE-CAPILLARY: 123 mg/dL — AB (ref 70–99)
Glucose-Capillary: 125 mg/dL — ABNORMAL HIGH (ref 70–99)
Glucose-Capillary: 157 mg/dL — ABNORMAL HIGH (ref 70–99)
Glucose-Capillary: 110 mg/dL — ABNORMAL HIGH (ref 70–99)

## 2014-01-09 LAB — BLOOD GAS, ARTERIAL
Acid-base deficit: 4.2 mmol/L — ABNORMAL HIGH (ref 0.0–2.0)
Acid-base deficit: 4.3 mmol/L — ABNORMAL HIGH (ref 0.0–2.0)
BICARBONATE: 20 meq/L (ref 20.0–24.0)
BICARBONATE: 20.4 meq/L (ref 20.0–24.0)
DRAWN BY: 225631
DRAWN BY: 252031
O2 CONTENT: 4 L/min
O2 CONTENT: 2 L/min
O2 Saturation: 98.2 %
O2 Saturation: 98.4 %
PCO2 ART: 34.6 mmHg — AB (ref 35.0–45.0)
PCO2 ART: 36.9 mmHg (ref 35.0–45.0)
PH ART: 7.381 (ref 7.350–7.450)
PO2 ART: 105 mmHg — AB (ref 80.0–100.0)
PO2 ART: 127 mmHg — AB (ref 80.0–100.0)
Patient temperature: 98.6
Patient temperature: 97.4
TCO2: 21.1 mmol/L (ref 0–100)
TCO2: 21.5 mmol/L (ref 0–100)
pH, Arterial: 7.357 (ref 7.350–7.450)

## 2014-01-09 LAB — BASIC METABOLIC PANEL
Anion gap: 18 — ABNORMAL HIGH (ref 5–15)
BUN: 77 mg/dL — AB (ref 6–23)
CALCIUM: 8.4 mg/dL (ref 8.4–10.5)
CO2: 19 mEq/L (ref 19–32)
Chloride: 109 mEq/L (ref 96–112)
Creatinine, Ser: 2.45 mg/dL — ABNORMAL HIGH (ref 0.50–1.10)
GFR calc non Af Amer: 17 mL/min — ABNORMAL LOW (ref >90)
GFR, EST AFRICAN AMERICAN: 20 mL/min — AB (ref >90)
GLUCOSE: 145 mg/dL — AB (ref 70–99)
POTASSIUM: 4.8 meq/L (ref 3.7–5.3)
SODIUM: 146 meq/L (ref 137–147)

## 2014-01-09 MED ORDER — ALBUTEROL SULFATE (2.5 MG/3ML) 0.083% IN NEBU
2.5000 mg | INHALATION_SOLUTION | RESPIRATORY_TRACT | Status: DC | PRN
  Filled 2014-01-09: qty 3

## 2014-01-09 MED ORDER — METOPROLOL TARTRATE 1 MG/ML IV SOLN
5.0000 mg | Freq: Three times a day (TID) | INTRAVENOUS | Status: DC
  Administered 2014-01-09 – 2014-01-11 (×4): 5 mg via INTRAVENOUS
  Filled 2014-01-09 (×15): qty 5

## 2014-01-09 MED ORDER — IOHEXOL 300 MG/ML  SOLN: 25.0000 mL | INTRAMUSCULAR | Status: AC

## 2014-01-09 MED ORDER — ALBUTEROL SULFATE (2.5 MG/3ML) 0.083% IN NEBU
2.5000 mg | INHALATION_SOLUTION | Freq: Four times a day (QID) | RESPIRATORY_TRACT | Status: DC
  Administered 2014-01-09 – 2014-01-11 (×6): 2.5 mg via RESPIRATORY_TRACT
  Filled 2014-01-09 (×5): qty 3

## 2014-01-09 NOTE — Progress Notes (Signed)
Spoke with daughter, [name], about all that has occurred this afternoon with this patient. Went over all new orders with her. [name] is on her way up here to visit with patient.

## 2014-01-09 NOTE — Progress Notes (Signed)
Patient ID: Brandi [name], female   DOB: April 08, 1929, 80 y.o.   MRN: [id number]    Subjective: Patient states she is not passing flatus, but is not nauseated. She is not eating no. She states her abdomen is tender.  Objective: Vital signs in last 24 hours: Temp:  [98.1 F (36.7 C)-99.3 F (37.4 C)] 98.1 F (36.7 C) (07/14 0515) Pulse Rate:  [87-98] 98 (07/14 0515) Resp:  [15-20] 20 (07/14 0515) BP: (143-162)/(62-72) 162/72 mmHg (07/14 0515) SpO2:  [91 %-96 %] 96 % (07/14 0515) Last BM Date: 01/04/14  Intake/Output from previous day: 07/13 0701 - 07/14 0700 In: 2300.7 [P.O.:280; I.V.:2020.7] Out: 2300 [Urine:2100; Emesis/NG output:200] Intake/Output this shift: Total I/O In: 120 [P.O.:120] Out: 200 [Urine:200]  PE: Abd: Soft, there is some distention. Her ventral hernia feels as if the bowel that is present is dilated. I do not think this is incarcerated though.  Lab Results:   Recent Labs  01/07/14 0355 01/08/14 0005  WBC 9.6 9.6  HGB 9.2* 9.9*  HCT 29.0* 31.4*  PLT 231 256   BMET  Recent Labs  01/08/14 0005 01/09/14 0105  NA 144 146  K 4.9 4.8  CL 107 109  CO2 20 19  GLUCOSE 115* 145*  BUN 80* 77*  CREATININE 2.64* 2.45*  CALCIUM 8.4 8.4   PT/INR No results found for this basename: LABPROT, INR,  in the last 72 hours CMP     Component Value Date/Time   NA 146 01/09/2014 0105   K 4.8 01/09/2014 0105   CL 109 01/09/2014 0105   CO2 19 01/09/2014 0105   GLUCOSE 145* 01/09/2014 0105   BUN 77* 01/09/2014 0105   CREATININE 2.45* 01/09/2014 0105   CALCIUM 8.4 01/09/2014 0105   PROT 5.3* 01/07/2014 0355   ALBUMIN 1.8* 01/07/2014 0355   AST 22 01/07/2014 0355   ALT 10 01/07/2014 0355   ALKPHOS 77 01/07/2014 0355   BILITOT 0.2* 01/07/2014 0355   GFRNONAA 17* 01/09/2014 0105   GFRAA 20* 01/09/2014 0105   Lipase     Component Value Date/Time   LIPASE 34 09/25/2011 1422       Studies/Results: No results found.  Anti-infectives: Anti-infectives   None       Assessment/Plan  1. Ventral hernia  Plan: 1. The patient's films yesterday suspected a possible ileus. The patient complains of some abdominal pain. Her hernia fills partially reduce however feels as if the bowel that is in the hernia is dilated. I'll recheck some plain films this morning to further evaluate this. At this time, she does not have a surgical abdomen. We will follow along.   LOS: 5 days    Synethia Endicott E 01/09/2014, 8:18 AM Pager: [phone]

## 2014-01-09 NOTE — Progress Notes (Addendum)
PATIENT DETAILS Name: Brandi [name] Age: 80 y.o. Sex: female Date of Birth: June 02, 1929 Admit Date: 01/04/2014 Admitting Physician Thurnell Lose, MD [id number], Javier Glazier, MD  Brief narrative:  80 year old morbidly obese female patient w/ a long-standing ventral hernia and recurrent abdominal pain. Patient was initially sent to [facility] from her skilled nursing facility after worsening abdominal pain over 24-48 hours. Imaging done at that time was concerning for possible incarcerated hernia. Because of her high-risk status for future surgical procedures she was refused admission by general surgery at that facility. Her case and clinical findings were discussed with General Surgery at [facility] and the patient was subsequently accepted to this hospital for medical admission with surgical consultation.  After arrival she was also evaluated by General Surgery. It was noted that prior to arrival the patient passed a large BM and expelled significant amount of flatus with improvement in her abdominal exam with decreased tenderness. It was felt that there was no evidence of ischemia and no evidence of obstruction. After arrival she was also evaluated by General Surgery. It was noted that prior to arrival the patient passed a large BM and expelled significant amount of flatus with improvement in her abdominal exam with decreased tenderness. It was felt that there was no evidence of ischemia and no evidence of obstruction.She was given supporitve care, and eventually transferred out of SDU on 7/13, however on 7/14-she was noted to be Short of breath, had worsening ileus on Xray, NGT was placed, patient is now being transferred back to SDU for closer monitoring.  Subjective: Claims to be SOB.  Assessment/Plan: Active Problems: Acuteon chronic Resp Failure-hypoxia -etiology not clear-not certain whether she aspirated, or has worsening ileus causing pressure on the diaphragm. -will  move back to SDU for closer monitoring, place NGT, check ABG and CXR.DNR reconfirmed with daughter. -Supportive care for now  Incarcerated ventral hernia (transient)/ Adynamic ileus  -general surgery following, per [facility] evidence of incarceration, however has developed ileus. Since Short of breath, worsening ileus on Abd Xray-place NGT tube. Gen Surgery planning to repeat CT Abd. Keep NPO.  Dehydration  -Resolved with IVF  Acute renal failure on Chronic kidney disease, stage III  -Baseline BUN and creatinine dating back to 2013 24/1.73 - avoid hypotension - avoid nephrotoxic medication  -spoke with daughter-Brenda-family understands patient a poor candidate for Dialysis. Family agreeable with placement of PICC line as they likely will not pursue HD in the future  DIABETES MELLITUS  -CBGs stable-c/w SSI  ANEMIA, IRON DEFICIENCY, CHRONIC  -Baseline hemoglobin around 10.8 as of 2013 - after admission Hgb drifted down to 6.8 - transfused 2 units packed red blood cells - Hgb stable for now - cont to follow   Hx of HYPERTENSION  Blood pressure now elevated--as NPO-start IV Lopressor  Chronic respiratory failure with hypoxia  Suspect mechanical issues related to recent abdominal pain and distention in setting of patient who is chronically bedbound with morbid obesity and likely chronic hypoventilation - suspect underlying chronic hypercarbia due to OHS/SA. See above, if PCO2 elevated, will need BiPAP  Protein calorie malnutrition  Albumin level I.7 with a total protein of 4.8   Morbid obesity - Body mass index is 61.08 kg/(m^2).   AORTIC STENOSIS, MILD   GERD   Disposition: Remain inpatient  DVT Prophylaxis: Prophylactic Heparin   Code Status: DNR-reconfirmed with daugther [name] over the phone  Family Communication Brenda-daughter-[phone]  Procedures:  None  CONSULTS:  general  surgery  Time spent 40 minutes-which includes 50% of the time with face-to-face with  patient/ family and coordinating care related to the above assessment and plan.  MEDICATIONS: Scheduled Meds: . albuterol  2.5 mg Nebulization Q6H  . antiseptic oral rinse  15 mL Mouth Rinse BID  . aspirin EC  81 mg Oral Daily  . budesonide-formoterol  2 puff Inhalation BID  . calcium carbonate  1 tablet Oral TID WC  . Chlorhexidine Gluconate Cloth  6 each Topical Q0600  . darbepoetin  25 mcg Subcutaneous Q14 Days  . fentaNYL  50 mcg Transdermal Q72H  . fluticasone  2 spray Each Nare Daily  . heparin subcutaneous  5,000 Units Subcutaneous 3 times per day  . insulin aspart  0-15 Units Subcutaneous TID WC  . loratadine  10 mg Oral Daily  . mupirocin ointment  1 application Nasal BID  . polyethylene glycol  17 g Oral BID  . Vitamin D (Ergocalciferol)  50,000 Units Oral Q7 days   Continuous Infusions: . sodium chloride 100 mL/hr at 01/09/14 1123   PRN Meds:.acetaminophen, albuterol, artificial tears, phenol  Antibiotics: Anti-infectives   None       PHYSICAL EXAM: Vital signs in last 24 hours: Filed Vitals:   01/08/14 1221 01/08/14 1345 01/08/14 2136 01/09/14 0515  BP: 156/64 147/62 158/66 162/72  Pulse: 88 87 90 98  Temp: 98.6 F (37 C) 99.3 F (37.4 C) 98.2 F (36.8 C) 98.1 F (36.7 C)  TempSrc: Oral Oral Oral Oral  Resp: 17 17 20 20   Height:      Weight:      SpO2: 91% 95% 95% 96%    Weight change:  Filed Weights   01/05/14 0316 01/06/14 0530  Weight: 157.398 kg (347 lb) 161.481 kg (356 lb)   Body mass index is 61.08 kg/(m^2).   Gen Exam: Awake,lethargic Neck: Supple, No JVD.   Chest: B/L Clear.  Poor air entry CVS: S1 S2 Regular, no murmurs.  Abdomen: soft, very obese belly-prevents proper exam-but non tender.  Extremities: 2+ edema, lower extremities warm to touch. Neurologic: Poor exam-but appears non focal Skin: No Rash.   Wounds: N/A.    Intake/Output from previous day:  Intake/Output Summary (Last 24 hours) at 01/09/14 1438 Last data filed at  01/09/14 0811  Gross per 24 hour  Intake 1451.67 ml  Output   1500 ml  Net -48.33 ml     LAB RESULTS: CBC  Recent Labs Lab 01/04/14 2229 01/05/14 0419 01/05/14 1934 01/05/14 2332 01/07/14 0355 01/08/14 0005  WBC 15.3* 13.1* 12.8* 11.7* 9.6 9.6  HGB 7.4* 6.8* 9.5* 9.4* 9.2* 9.9*  HCT 23.1* 21.2* 29.3* 29.5* 29.0* 31.4*  PLT 221 200 211 189 231 256  MCV 91.7 91.4 90.2 92.5 91.8 91.8  MCH 29.4 29.3 29.2 29.5 29.1 28.9  MCHC 32.0 32.1 32.4 31.9 31.7 31.5  RDW 13.8 14.0 14.2 14.6 15.1 15.0  LYMPHSABS 0.8 0.8  --   --   --   --   MONOABS 1.1* 1.0  --   --   --   --   EOSABS 0.2 0.3  --   --   --   --   BASOSABS 0.0 0.0  --   --   --   --     Chemistries   Recent Labs Lab 01/05/14 1934 01/05/14 2332 01/07/14 0355 01/08/14 0005 01/09/14 0105  NA 141 141 143 144 146  K 4.3 4.8 5.7* 4.9 4.8  CL 102  105 107 107 109  CO2 22 20 20 20 19   GLUCOSE 90 91 99 115* 145*  BUN 88* 86* 82* 80* 77*  CREATININE 3.00* 2.84* 2.86* 2.64* 2.45*  CALCIUM 7.7* 7.6* 8.0* 8.4 8.4    CBG:  Recent Labs Lab 01/08/14 1220 01/08/14 1657 01/08/14 2132 01/13/2014 0819 01-13-14 1243  GLUCAP 123* 104* 139* 125* 157*    GFR Estimated Creatinine Clearance: 26.8 ml/min (by C-G formula based on Cr of 2.45).  Coagulation profile No results found for this basename: INR, PROTIME,  in the last 168 hours  Cardiac Enzymes  Recent Labs Lab 01/04/14 2229 01/05/14 0419 01/05/14 0800  TROPONINI <0.30 <0.30 <0.30    No components found with this basename: POCBNP,  No results found for this basename: DDIMER,  in the last 72 hours No results found for this basename: HGBA1C,  in the last 72 hours No results found for this basename: CHOL, HDL, LDLCALC, TRIG, CHOLHDL, LDLDIRECT,  in the last 72 hours No results found for this basename: TSH, T4TOTAL, FREET3, T3FREE, THYROIDAB,  in the last 72 hours No results found for this basename: VITAMINB12, FOLATE, FERRITIN, TIBC, IRON, RETICCTPCT,  in the  last 72 hours No results found for this basename: LIPASE, AMYLASE,  in the last 72 hours  Urine Studies No results found for this basename: UACOL, UAPR, USPG, UPH, UTP, UGL, UKET, UBIL, UHGB, UNIT, UROB, ULEU, UEPI, UWBC, URBC, UBAC, CAST, CRYS, UCOM, BILUA,  in the last 72 hours  MICROBIOLOGY: Recent Results (from the past 240 hour(s))  MRSA PCR SCREENING     Status: Abnormal   Collection Time    01/04/14  6:30 PM      Result Value Ref Range Status   MRSA by PCR POSITIVE (*) NEGATIVE Final   Comment:            The GeneXpert MRSA Assay (FDA     approved for NASAL specimens     only), is one component of a     comprehensive MRSA colonization     surveillance program. It is not     intended to diagnose MRSA     infection nor to guide or     monitor treatment for     MRSA infections.     RESULT CALLED TO, READ BACK BY AND VERIFIED WITH:     Sandrea Matte RN 2202 01/05/11 A BROWNING    RADIOLOGY STUDIES/RESULTS: Dg Abd 2 Views  2014-01-13   CLINICAL DATA:  Hernia.  Ileus.  EXAM: ABDOMEN - 2 VIEW  COMPARISON:  01/07/2014  FINDINGS: There is diffuse colonic dilation with air-fluid levels on the decubitus view. Findings are consistent with a diffuse colonic adynamic ileus. A distal colonic obstruction is also possible but felt less likely. There is no free air. Suture material and vascular clips are noted in the right upper quadrant, unchanged. The calcification described on the prior study to the right of the lower lumbar spine is also unchanged most likely a phlebolith.  IMPRESSION: 1. Findings are consistent with a diffuse colonic adynamic ileus. Colon distention has increased from the prior study. A distal colonic obstruction is not excluded but felt less likely. No free air.   Electronically Signed   By: Lajean Manes M.D.   On: 13-Jan-2014 09:29   Dg Abd Portable 1v  01/07/2014   CLINICAL DATA:  Evaluate bowel gas pattern.  EXAM: PORTABLE ABDOMEN - 1 VIEW  COMPARISON:  01/04/2014; CT abdomen  pelvis - 09/25/2011.  FINDINGS: The exam in is markedly degraded due to patient body habitus and portable technique.  There is mild to moderate gas distention of predominantly the colon, unchanged minimally improved in the interval. There is no definitive gas distention of the small bowel. Nondiagnostic evaluation for pneumoperitoneum secondary supine positioning and patient respiratory artifact degrading evaluation of the lower thorax. No definite pneumatosis or portal venous gas. Multiple surgical clips overlie the right upper abdomen. Punctate calcification overlying the medial aspect of the right mid abdomen likely represents the gonadal vein phlebolith demonstrated on prior abdominal CT.  IMPRESSION: Degraded examination with no change to minimally improved appearance of suspected ileus.   Electronically Signed   By: Sandi Mariscal M.D.   On: 01/07/2014 12:42   Dg Abd Portable 1v  01/05/2014   CLINICAL DATA:  Bowel obstruction.  EXAM: PORTABLE ABDOMEN - 1 VIEW  COMPARISON:  CT abdomen and pelvis 09/25/2011  FINDINGS: Technically limited study due to patient body habitus. Abdomen is not entirely included within the field of view. There appears to be diffuse gaseous distention of bowel, probably colon. Appearance is most consistent with adynamic ileus. Surgical clips in the right upper quadrant.  IMPRESSION: Technically limited study. Gaseous distention of bowel most likely representing colon. Consider adynamic ileus.   Electronically Signed   By: Lucienne Capers M.D.   On: 01/05/2014 00:57    Oren Binet, MD  [facility] Pager:[phone]  If 7PM-7AM, please contact night-coverage [email or web address] Password [email or web address] 01/09/2014, 2:38 PM   LOS: 5 days   **Disclaimer: This note may have been dictated with voice recognition software. Similar sounding words can inadvertently be transcribed and this note may contain transcription errors which may not have been corrected upon publication of  note.**

## 2014-01-09 NOTE — Clinical Social Work Psychosocial (Signed)
Clinical Social Work Department BRIEF PSYCHOSOCIAL ASSESSMENT 01/09/2014  Patient:  Brandi [name], Brandi [name]     Account Number:  [id number]     Admit date:  01/04/2014  Clinical Social Worker:  Delrae Sawyers  Date/Time:  01/09/2014 12:30 PM  Referred by:  Physician  Date Referred:  01/09/2014 Referred for  SNF Placement   Other Referral:   none.   Interview type:  Family Other interview type:   CSW spoke with pt's daughter, Corrin Parker [phone]).    PSYCHOSOCIAL DATA Living Status:  FACILITY Admitted from facility:  [facility] Level of care:  [place] Primary support name:  Corrin Parker Primary support relationship to patient:  CHILD, ADULT Degree of support available:   Strong support system.    CURRENT CONCERNS Current Concerns  Post-Acute Placement   Other Concerns:   none.    SOCIAL WORK ASSESSMENT / PLAN CSW received consult stating pt is resident at [facility]. CSW contacted pt's daughter, Corrin Parker, regarding discharge disposition. Pt's daughter stated pt has been a resident at [facility] for the past 7-8 years. Pt's daughter agreeable to pt returning at time of discharge.    CSW to continue to follow and assist with discharge planning needs.   Assessment/plan status:  Psychosocial Support/Ongoing Assessment of Needs Other assessment/ plan:   none.   Information/referral to community resources:   Pt returning to [facility].    PATIENT'S/FAMILY'S RESPONSE TO PLAN OF CARE: Pt's daughter understanding and agreeable to CSW plan of care. Pt's daughter expressed no further questions or concerns at this time.       Lubertha Sayres, MSW, [facility] Licensed Clinical Social Worker [phone] and [phone]

## 2014-01-09 NOTE — Progress Notes (Signed)
I have seen and examined the patient and agree with the assessment and plans. Will check x rays.  Stool softeners too  Darby Shadwick A. Ninfa Linden  MD, FACS

## 2014-01-10 ENCOUNTER — Encounter (HOSPITAL_COMMUNITY)
Admission: AD | Disposition: A | Discharge status: Skilled Nursing Facility | Payer: Self-pay | Source: Other Acute Inpatient Hospital | Attending: Internal Medicine

## 2014-01-10 ENCOUNTER — Inpatient Hospital Stay (HOSPITAL_COMMUNITY): Payer: PRIVATE HEALTH INSURANCE

## 2014-01-10 ENCOUNTER — Inpatient Hospital Stay (HOSPITAL_COMMUNITY): Payer: PRIVATE HEALTH INSURANCE | Admitting: Anesthesiology

## 2014-01-10 ENCOUNTER — Encounter (HOSPITAL_COMMUNITY): Payer: PRIVATE HEALTH INSURANCE | Admitting: Anesthesiology

## 2014-01-10 ENCOUNTER — Encounter (HOSPITAL_COMMUNITY): Payer: Self-pay | Admitting: Anesthesiology

## 2014-01-10 DIAGNOSIS — K436 Other and unspecified ventral hernia with obstruction, without gangrene: Secondary | ICD-10-CM

## 2014-01-10 DIAGNOSIS — K430 Incisional hernia with obstruction, without gangrene: Secondary | ICD-10-CM

## 2014-01-10 DIAGNOSIS — K5669 Other intestinal obstruction: Secondary | ICD-10-CM

## 2014-01-10 HISTORY — PX: INCISIONAL HERNIA REPAIR: SHX193

## 2014-01-10 HISTORY — PX: COLECTOMY: SHX59

## 2014-01-10 LAB — BLOOD GAS, ARTERIAL
Acid-base deficit: 4 mmol/L — ABNORMAL HIGH (ref 0.0–2.0)
Acid-base deficit: 4.2 mmol/L — ABNORMAL HIGH (ref 0.0–2.0)
BICARBONATE: 23 meq/L (ref 20.0–24.0)
Bicarbonate: 20.8 mEq/L (ref 20.0–24.0)
Delivery systems: POSITIVE
Drawn by: 244801
Expiratory PAP: 5
FIO2: 0.4 %
Inspiratory PAP: 12
O2 Content: 10 L/min
O2 SAT: 88.3 %
O2 Saturation: 96.4 %
PATIENT TEMPERATURE: 97.8
PCO2 ART: 41.6 mmHg (ref 35.0–45.0)
PH ART: 7.321 — AB (ref 7.350–7.450)
Patient temperature: 98.6
TCO2: 24.9 mmol/L (ref 0–100)
TCO2: 22.1 mmol/L (ref 0–100)
pCO2 arterial: 60.1 mmHg (ref 35.0–45.0)
pH, Arterial: 7.204 — ABNORMAL LOW (ref 7.350–7.450)
pO2, Arterial: 67.4 mmHg — ABNORMAL LOW (ref 80.0–100.0)
pO2, Arterial: 94.6 mmHg (ref 80.0–100.0)

## 2014-01-10 LAB — BASIC METABOLIC PANEL
Anion gap: 17 — ABNORMAL HIGH (ref 5–15)
BUN: 73 mg/dL — AB (ref 6–23)
CO2: 19 meq/L (ref 19–32)
Calcium: 8.1 mg/dL — ABNORMAL LOW (ref 8.4–10.5)
Chloride: 110 mEq/L (ref 96–112)
Creatinine, Ser: 2.44 mg/dL — ABNORMAL HIGH (ref 0.50–1.10)
GFR calc Af Amer: 20 mL/min — ABNORMAL LOW (ref >90)
GFR calc non Af Amer: 17 mL/min — ABNORMAL LOW (ref >90)
GLUCOSE: 96 mg/dL (ref 70–99)
POTASSIUM: 4.6 meq/L (ref 3.7–5.3)
SODIUM: 146 meq/L (ref 137–147)

## 2014-01-10 LAB — CBC
HEMATOCRIT: 31.7 % — AB (ref 36.0–46.0)
Hemoglobin: 10.1 g/dL — ABNORMAL LOW (ref 12.0–15.0)
MCH: 29.3 pg (ref 26.0–34.0)
MCHC: 31.9 g/dL (ref 30.0–36.0)
MCV: 91.9 fL (ref 78.0–100.0)
Platelets: 271 10*3/uL (ref 150–400)
RBC: 3.45 MIL/uL — AB (ref 3.87–5.11)
RDW: 15.1 % (ref 11.5–15.5)
WBC: 8.5 10*3/uL (ref 4.0–10.5)

## 2014-01-10 LAB — GLUCOSE, CAPILLARY
GLUCOSE-CAPILLARY: 111 mg/dL — AB (ref 70–99)
GLUCOSE-CAPILLARY: 127 mg/dL — AB (ref 70–99)
Glucose-Capillary: 96 mg/dL (ref 70–99)
Glucose-Capillary: 94 mg/dL (ref 70–99)

## 2014-01-10 SURGERY — REPAIR, HERNIA, INCISIONAL
Anesthesia: General | Site: Abdomen

## 2014-01-10 MED ORDER — HYDROMORPHONE HCL PF 1 MG/ML IJ SOLN
INTRAMUSCULAR | Status: AC
  Administered 2014-01-10: 0.25 mg via INTRAVENOUS
  Filled 2014-01-10: qty 1

## 2014-01-10 MED ORDER — CEFAZOLIN SODIUM-DEXTROSE 2-3 GM-% IV SOLR: 2.0000 g | Freq: Three times a day (TID) | INTRAVENOUS | Status: DC

## 2014-01-10 MED ORDER — NEOSTIGMINE METHYLSULFATE 10 MG/10ML IV SOLN
INTRAVENOUS | Status: DC | PRN
  Administered 2014-01-10: 5 mg via INTRAVENOUS

## 2014-01-10 MED ORDER — FENTANYL CITRATE 0.05 MG/ML IJ SOLN
INTRAMUSCULAR | Status: AC
  Filled 2014-01-10: qty 5

## 2014-01-10 MED ORDER — ONDANSETRON HCL 4 MG/2ML IJ SOLN
INTRAMUSCULAR | Status: DC | PRN
  Administered 2014-01-10: 4 mg via INTRAVENOUS

## 2014-01-10 MED ORDER — LACTATED RINGERS IV SOLN
INTRAVENOUS | Status: DC | PRN
  Administered 2014-01-10 (×2): via INTRAVENOUS

## 2014-01-10 MED ORDER — LIDOCAINE HCL (CARDIAC) 20 MG/ML IV SOLN
INTRAVENOUS | Status: DC | PRN
  Administered 2014-01-10: 80 mg via INTRAVENOUS

## 2014-01-10 MED ORDER — NEOSTIGMINE METHYLSULFATE 10 MG/10ML IV SOLN
INTRAVENOUS | Status: AC
  Filled 2014-01-10: qty 1

## 2014-01-10 MED ORDER — PROMETHAZINE HCL 25 MG/ML IJ SOLN: 6.2500 mg | INTRAMUSCULAR | Status: DC | PRN

## 2014-01-10 MED ORDER — DEXTROSE 5 % IV SOLN
3.0000 g | INTRAVENOUS | Status: AC
  Administered 2014-01-10: 3 g via INTRAVENOUS
  Filled 2014-01-10: qty 3000

## 2014-01-10 MED ORDER — ARTIFICIAL TEARS OP OINT
TOPICAL_OINTMENT | OPHTHALMIC | Status: AC
  Filled 2014-01-10: qty 3.5

## 2014-01-10 MED ORDER — LABETALOL HCL 5 MG/ML IV SOLN
INTRAVENOUS | Status: DC | PRN
  Administered 2014-01-10: 5 mg via INTRAVENOUS
  Administered 2014-01-10 (×2): 2.5 mg via INTRAVENOUS
  Administered 2014-01-10: 5 mg via INTRAVENOUS

## 2014-01-10 MED ORDER — MORPHINE SULFATE 2 MG/ML IJ SOLN
1.0000 mg | INTRAMUSCULAR | Status: DC | PRN
  Administered 2014-01-11: 2 mg via INTRAVENOUS
  Filled 2014-01-10: qty 1

## 2014-01-10 MED ORDER — HYDROMORPHONE HCL PF 1 MG/ML IJ SOLN
0.2500 mg | INTRAMUSCULAR | Status: DC | PRN
  Administered 2014-01-10 (×5): 0.25 mg via INTRAVENOUS

## 2014-01-10 MED ORDER — VECURONIUM BROMIDE 10 MG IV SOLR
INTRAVENOUS | Status: DC | PRN
  Administered 2014-01-10 (×2): 1 mg via INTRAVENOUS

## 2014-01-10 MED ORDER — SUCCINYLCHOLINE CHLORIDE 20 MG/ML IJ SOLN
INTRAMUSCULAR | Status: DC | PRN
  Administered 2014-01-10: 60 mg via INTRAVENOUS

## 2014-01-10 MED ORDER — GLYCOPYRROLATE 0.2 MG/ML IJ SOLN
INTRAMUSCULAR | Status: DC | PRN
  Administered 2014-01-10: .8 mg via INTRAVENOUS

## 2014-01-10 MED ORDER — GLYCOPYRROLATE 0.2 MG/ML IJ SOLN
INTRAMUSCULAR | Status: AC
  Filled 2014-01-10: qty 5

## 2014-01-10 MED ORDER — ONDANSETRON HCL 4 MG/2ML IJ SOLN
INTRAMUSCULAR | Status: AC
  Filled 2014-01-10: qty 2

## 2014-01-10 MED ORDER — FENTANYL CITRATE 0.05 MG/ML IJ SOLN
INTRAMUSCULAR | Status: DC | PRN
  Administered 2014-01-10 (×5): 50 ug via INTRAVENOUS

## 2014-01-10 MED ORDER — ACETAMINOPHEN 325 MG PO TABS: 325.0000 mg | ORAL_TABLET | ORAL | Status: DC | PRN

## 2014-01-10 MED ORDER — SODIUM CHLORIDE 0.9 % IV SOLN
INTRAVENOUS | Status: DC
  Administered 2014-01-10: 14:00:00 via INTRAVENOUS

## 2014-01-10 MED ORDER — OXYCODONE HCL 5 MG PO TABS: 5.0000 mg | ORAL_TABLET | Freq: Once | ORAL | Status: DC | PRN

## 2014-01-10 MED ORDER — LIDOCAINE HCL (CARDIAC) 20 MG/ML IV SOLN
INTRAVENOUS | Status: AC
  Filled 2014-01-10: qty 5

## 2014-01-10 MED ORDER — PROPOFOL 10 MG/ML IV BOLUS
INTRAVENOUS | Status: AC
  Filled 2014-01-10: qty 20

## 2014-01-10 MED ORDER — PROPOFOL 10 MG/ML IV BOLUS
INTRAVENOUS | Status: DC | PRN
Start: 2014-01-10 — End: 2014-01-10
  Administered 2014-01-10: 70 mg via INTRAVENOUS

## 2014-01-10 MED ORDER — ARTIFICIAL TEARS OP OINT
TOPICAL_OINTMENT | OPHTHALMIC | Status: DC | PRN
  Administered 2014-01-10: 1 via OPHTHALMIC

## 2014-01-10 MED ORDER — 0.9 % SODIUM CHLORIDE (POUR BTL) OPTIME
TOPICAL | Status: DC | PRN
  Administered 2014-01-10: 1000 mL

## 2014-01-10 MED ORDER — VECURONIUM BROMIDE 10 MG IV SOLR
INTRAVENOUS | Status: AC
  Filled 2014-01-10: qty 10

## 2014-01-10 MED ORDER — ACETAMINOPHEN 160 MG/5ML PO SOLN
325.0000 mg | ORAL | Status: DC | PRN
  Filled 2014-01-10: qty 20.3

## 2014-01-10 MED ORDER — STERILE WATER FOR INJECTION IJ SOLN
INTRAMUSCULAR | Status: AC
  Filled 2014-01-10: qty 10

## 2014-01-10 MED ORDER — OXYCODONE HCL 5 MG/5ML PO SOLN: 5.0000 mg | Freq: Once | ORAL | Status: DC | PRN

## 2014-01-10 MED ORDER — ROCURONIUM BROMIDE 100 MG/10ML IV SOLN
INTRAVENOUS | Status: DC | PRN
  Administered 2014-01-10: 50 mg via INTRAVENOUS

## 2014-01-10 SURGICAL SUPPLY — 59 items
BLADE SURG ROTATE 9660 (MISCELLANEOUS) IMPLANT
CANISTER SUCTION 2500CC (MISCELLANEOUS) ×3 IMPLANT
CHLORAPREP W/TINT 26ML (MISCELLANEOUS) ×3 IMPLANT
COVER SURGICAL LIGHT HANDLE (MISCELLANEOUS) ×3 IMPLANT
DRAPE LAPAROSCOPIC ABDOMINAL (DRAPES) ×3 IMPLANT
DRAPE UTILITY 15X26 W/TAPE STR (DRAPE) ×6 IMPLANT
DRSG PAD ABDOMINAL 8X10 ST (GAUZE/BANDAGES/DRESSINGS) ×2 IMPLANT
DRSG TEGADERM 4X4.75 (GAUZE/BANDAGES/DRESSINGS) ×1 IMPLANT
ELECT CAUTERY BLADE 6.4 (BLADE) ×3 IMPLANT
ELECT REM PT RETURN 9FT ADLT (ELECTROSURGICAL) ×3
ELECTRODE REM PT RTRN 9FT ADLT (ELECTROSURGICAL) ×1 IMPLANT
GLOVE BIO SURGEON STRL SZ 6 (GLOVE) ×2 IMPLANT
GLOVE BIO SURGEON STRL SZ7.5 (GLOVE) ×2 IMPLANT
GLOVE BIO SURGEON STRL SZ8 (GLOVE) ×2 IMPLANT
GLOVE BIOGEL PI IND STRL 6.5 (GLOVE) IMPLANT
GLOVE BIOGEL PI IND STRL 7.5 (GLOVE) IMPLANT
GLOVE BIOGEL PI IND STRL 8 (GLOVE) IMPLANT
GLOVE BIOGEL PI INDICATOR 6.5 (GLOVE) ×2
GLOVE BIOGEL PI INDICATOR 7.5 (GLOVE) ×6
GLOVE BIOGEL PI INDICATOR 8 (GLOVE) ×2
GLOVE SURG SIGNA 7.5 PF LTX (GLOVE) ×3 IMPLANT
GLOVE SURG SS PI 7.0 STRL IVOR (GLOVE) ×4 IMPLANT
GOWN STRL REUS W/ TWL LRG LVL3 (GOWN DISPOSABLE) ×1 IMPLANT
GOWN STRL REUS W/ TWL XL LVL3 (GOWN DISPOSABLE) ×1 IMPLANT
GOWN STRL REUS W/TWL LRG LVL3 (GOWN DISPOSABLE) ×9
GOWN STRL REUS W/TWL XL LVL3 (GOWN DISPOSABLE) ×6
IV CATH 14GX2 1/4 (CATHETERS) ×2 IMPLANT
KIT BASIN OR (CUSTOM PROCEDURE TRAY) ×3 IMPLANT
KIT OSTOMY DRAINABLE 2.75 STR (WOUND CARE) ×2 IMPLANT
KIT ROOM TURNOVER OR (KITS) ×3 IMPLANT
LIGASURE IMPACT 36 18CM CVD LR (INSTRUMENTS) ×2 IMPLANT
NDL HYPO 25GX1X1/2 BEV (NEEDLE) ×1 IMPLANT
NEEDLE HYPO 25GX1X1/2 BEV (NEEDLE) IMPLANT
NS IRRIG 1000ML POUR BTL (IV SOLUTION) ×3 IMPLANT
PACK GENERAL/GYN (CUSTOM PROCEDURE TRAY) ×3 IMPLANT
PAD ARMBOARD 7.5X6 YLW CONV (MISCELLANEOUS) ×3 IMPLANT
SPONGE GAUZE 4X4 12PLY (GAUZE/BANDAGES/DRESSINGS) ×1 IMPLANT
SPONGE GAUZE 4X4 12PLY STER LF (GAUZE/BANDAGES/DRESSINGS) ×2 IMPLANT
SPONGE LAP 18X18 X RAY DECT (DISPOSABLE) ×4 IMPLANT
STAPLER PROXIMATE 75MM BLUE (STAPLE) ×2 IMPLANT
STAPLER VISISTAT 35W (STAPLE) ×2 IMPLANT
SUT MNCRL AB 4-0 PS2 18 (SUTURE) ×1 IMPLANT
SUT NOVA NAB DX-16 0-1 5-0 T12 (SUTURE) IMPLANT
SUT PDS AB 1 TP1 96 (SUTURE) ×4 IMPLANT
SUT PROLENE 1 CT (SUTURE) IMPLANT
SUT SILK 2 0 (SUTURE) ×3
SUT SILK 2 0SH CR/8 30 (SUTURE) ×2 IMPLANT
SUT SILK 2-0 18XBRD TIE 12 (SUTURE) IMPLANT
SUT SILK 3 0 SH CR/8 (SUTURE) ×2 IMPLANT
SUT VIC AB 3-0 SH 18 (SUTURE) ×4 IMPLANT
SUT VIC AB 3-0 SH 27 (SUTURE) ×3
SUT VIC AB 3-0 SH 27XBRD (SUTURE) ×1 IMPLANT
SYR CONTROL 10ML LL (SYRINGE) ×1 IMPLANT
TAPE CLOTH SURG 4X10 WHT LF (GAUZE/BANDAGES/DRESSINGS) ×2 IMPLANT
TOWEL OR 17X24 6PK STRL BLUE (TOWEL DISPOSABLE) ×1 IMPLANT
TOWEL OR 17X26 10 PK STRL BLUE (TOWEL DISPOSABLE) ×3 IMPLANT
TOWEL OR NON WOVEN STRL DISP B (DISPOSABLE) ×4 IMPLANT
TRAY FOLEY CATH 14FRSI W/METER (CATHETERS) IMPLANT
YANKAUER SUCT BULB TIP NO VENT (SUCTIONS) ×2 IMPLANT

## 2014-01-10 NOTE — Anesthesia Preprocedure Evaluation (Addendum)
Anesthesia Evaluation  Patient identified by MRN, date of birth, ID band Patient awake    Reviewed: Allergy & Precautions, H&P , NPO status , Patient's Chart, lab work & pertinent test results  History of Anesthesia Complications Negative for: history of anesthetic complications  Airway Mallampati: II TM Distance: >3 FB Neck ROM: full    Dental  (+) Edentulous Upper, Edentulous Lower, Dental Advidsory Given   Pulmonary shortness of breath and at rest, neg sleep apnea, COPD COPD inhaler and oxygen dependent,          Cardiovascular hypertension, Pt. on medications +CHF Rhythm:Regular     Neuro/Psych PSYCHIATRIC DISORDERS Depression  Neuromuscular disease    GI/Hepatic Neg liver ROS, PUD, GERD-  Medicated,  Endo/Other  diabetes, Type 1, Insulin DependentMorbid obesity  Renal/GU ARF and CRFRenal disease     Musculoskeletal   Abdominal   Peds  Hematology  (+) anemia ,   Anesthesia Other Findings   Reproductive/Obstetrics                         Anesthesia Physical Anesthesia Plan  ASA: III  Anesthesia Plan: General   Post-op Pain Management:    Induction: Intravenous  Airway Management Planned: Oral ETT  Additional Equipment:   Intra-op Plan:   Post-operative Plan: Extubation in OR  Informed Consent: I have reviewed the patients History and Physical, chart, labs and discussed the procedure including the risks, benefits and alternatives for the proposed anesthesia with the patient or authorized representative who has indicated his/her understanding and acceptance.   Dental Advisory Given  Plan Discussed with: Anesthesiologist, CRNA and Surgeon  Anesthesia Plan Comments:        Anesthesia Quick Evaluation

## 2014-01-10 NOTE — Progress Notes (Signed)
PATIENT DETAILS Name: Brandi [name] Age: 80 y.o. Sex: female Date of Birth: 02/09/1929 Admit Date: 01/04/2014 Admitting Physician Thurnell Lose, MD [id number], Javier Glazier, MD  Discussed with Dr. Ninfa Linden  Brief narrative:  80 year old morbidly obese female patient w/ a long-standing ventral hernia and recurrent abdominal pain. Patient was initially sent to [facility] from her skilled nursing facility after worsening abdominal pain over 24-48 hours. Imaging done at that time was concerning for possible incarcerated hernia. Because of her high-risk status for future surgical procedures she was refused admission by general surgery at that facility. Her case and clinical findings were discussed with General Surgery at [facility] and the patient was subsequently accepted to this hospital for medical admission with surgical consultation.   After arrival she was also evaluated by General Surgery. It was noted that prior to arrival the patient passed a large BM and expelled significant amount of flatus with improvement in her abdominal exam with decreased tenderness. It was felt that there was no evidence of ischemia and no evidence of obstruction.She was given supporitve care, and eventually transferred out of SDU on 7/13, however on 7/14-she was noted to be Short of breath, had worsening ileus on Xray, NGT was placed, patient is now being transferred back to SDU for closer monitoring.  Surgery tentatively planned for 7/15  Subjective: No CP, no SOB, would like to be adjusted in the bed.  Assessment/Plan:  Acute on chronic Resp Failure-hypoxia -etiology not clear-not certain whether she aspirated, or has worsening ileus causing pressure on the diaphragm. -will move back to SDU for closer monitoring, place NGT, ABG ok  DNR reconfirmed with daughter. Added incentive spirometry -Supportive care for now -if developes fever, will add abx  Incarcerated ventral hernia (transient)/  Adynamic ileus  -general surgery following repeat CT Abd.  Keep NPO -possible surgery   Dehydration  -continue IVF at a lower rate  Acute renal failure on Chronic kidney disease, stage III  -Baseline BUN and creatinine dating back to 2013 24/1.73 - avoid hypotension - avoid nephrotoxic medication  -spoke with daughter-Brenda-family understands patient a poor candidate for Dialysis. Family agreeable with placement of PICC line as they likely will not pursue HD in the future  DIABETES MELLITUS  -CBGs stable-c/w SSI  ANEMIA, IRON DEFICIENCY, CHRONIC  -Baseline hemoglobin around 10.8 as of 2013 - after admission Hgb drifted down to 6.8 - transfused 2 units packed red blood cells - Hgb stable for now - cont to follow   Hx of HYPERTENSION  Blood pressure now elevated--as NPO-start IV Lopressor  Protein calorie malnutrition  Albumin level I.7 with a total protein of 4.8   Morbid obesity - Body mass index is 61.08 kg/(m^2).   AORTIC STENOSIS, MILD   GERD   Disposition: Remain inpatient  DVT Prophylaxis: Prophylactic Heparin   Code Status: DNR-reconfirmed with daugther Brandi [name] over the phone  Family Communication Brenda-daughter-[phone]  Procedures:  None  CONSULTS:  general surgery  Time spent 35 minutes-which includes 50% of the time with face-to-face with patient/ family and coordinating care related to the above assessment and plan.  MEDICATIONS: Scheduled Meds: . albuterol  2.5 mg Nebulization Q6H  . antiseptic oral rinse  15 mL Mouth Rinse BID  . aspirin EC  81 mg Oral Daily  . budesonide-formoterol  2 puff Inhalation BID  . calcium carbonate  1 tablet Oral TID WC  . darbepoetin  25 mcg Subcutaneous Q14 Days  . fentaNYL  50 mcg Transdermal Q72H  . fluticasone  2 spray Each Nare Daily  . heparin subcutaneous  5,000 Units Subcutaneous 3 times per day  . insulin aspart  0-15 Units Subcutaneous TID WC  . loratadine  10 mg Oral Daily  . metoprolol  5 mg  Intravenous 3 times per day  . mupirocin ointment  1 application Nasal BID  . polyethylene glycol  17 g Oral BID  . Vitamin D (Ergocalciferol)  50,000 Units Oral Q7 days   Continuous Infusions: . sodium chloride 150 mL/hr at 01/10/14 0338   PRN Meds:.acetaminophen, albuterol, artificial tears, phenol  Antibiotics: Anti-infectives   None       PHYSICAL EXAM: Vital signs in last 24 hours: Filed Vitals:   01/09/14 2345 01/10/14 0158 01/10/14 0400 01/10/14 0800  BP: 130/51  116/41 122/74  Pulse: 85  86 85  Temp: 98.1 F (36.7 C)  98.3 F (36.8 C) 97.8 F (36.6 C)  TempSrc: Axillary  Axillary Oral  Resp: 17  12 16   Height:      Weight:      SpO2: 100% 95% 100% 95%    Weight change:  Filed Weights   01/05/14 0316 01/06/14 0530  Weight: 157.398 kg (347 lb) 161.481 kg (356 lb)   Body mass index is 61.08 kg/(m^2).   Gen Exam: Awake, answers questions Neck: Supple, No JVD.   Chest: B/L Clear.  Poor air entry CVS: S1 S2 Regular, no murmurs.  Abdomen: soft, very obese belly-prevents proper exam-tender to deep palpation  Extremities: 2+ edema, lower extremities warm to touch. Neurologic: Poor exam-but appears non focal Skin: No Rash.   Wounds: N/A.    Intake/Output from previous day:  Intake/Output Summary (Last 24 hours) at 01/10/14 0820 Last data filed at 01/10/14 0600  Gross per 24 hour  Intake   1550 ml  Output    500 ml  Net   1050 ml     LAB RESULTS: CBC  Recent Labs Lab 01/04/14 2229 01/05/14 0419 01/05/14 1934 01/05/14 2332 01/07/14 0355 01/08/14 0005 01/10/14 0443  WBC 15.3* 13.1* 12.8* 11.7* 9.6 9.6 8.5  HGB 7.4* 6.8* 9.5* 9.4* 9.2* 9.9* 10.1*  HCT 23.1* 21.2* 29.3* 29.5* 29.0* 31.4* 31.7*  PLT 221 200 211 189 231 256 271  MCV 91.7 91.4 90.2 92.5 91.8 91.8 91.9  MCH 29.4 29.3 29.2 29.5 29.1 28.9 29.3  MCHC 32.0 32.1 32.4 31.9 31.7 31.5 31.9  RDW 13.8 14.0 14.2 14.6 15.1 15.0 15.1  LYMPHSABS 0.8 0.8  --   --   --   --   --   MONOABS 1.1*  1.0  --   --   --   --   --   EOSABS 0.2 0.3  --   --   --   --   --   BASOSABS 0.0 0.0  --   --   --   --   --     Chemistries   Recent Labs Lab 01/05/14 2332 01/07/14 0355 01/08/14 0005 01/09/14 0105 01/10/14 0443  NA 141 143 144 146 146  K 4.8 5.7* 4.9 4.8 4.6  CL 105 107 107 109 110  CO2 20 20 20 19 19   GLUCOSE 91 99 115* 145* 96  BUN 86* 82* 80* 77* 73*  CREATININE 2.84* 2.86* 2.64* 2.45* 2.44*  CALCIUM 7.6* 8.0* 8.4 8.4 8.1*    CBG:  Recent Labs Lab 01/09/14 1243 01/09/14 1428 01/09/14 1635 01/09/14 2133 01/10/14 0759  GLUCAP 157*  145* 110* 123* 96    GFR Estimated Creatinine Clearance: 26.9 ml/min (by C-G formula based on Cr of 2.44).  Coagulation profile No results found for this basename: INR, PROTIME,  in the last 168 hours  Cardiac Enzymes  Recent Labs Lab 01/04/14 2229 01/05/14 0419 01/05/14 0800  TROPONINI <0.30 <0.30 <0.30    No components found with this basename: POCBNP,  No results found for this basename: DDIMER,  in the last 72 hours No results found for this basename: HGBA1C,  in the last 72 hours No results found for this basename: CHOL, HDL, LDLCALC, TRIG, CHOLHDL, LDLDIRECT,  in the last 72 hours No results found for this basename: TSH, T4TOTAL, FREET3, T3FREE, THYROIDAB,  in the last 72 hours No results found for this basename: VITAMINB12, FOLATE, FERRITIN, TIBC, IRON, RETICCTPCT,  in the last 72 hours No results found for this basename: LIPASE, AMYLASE,  in the last 72 hours  Urine Studies No results found for this basename: UACOL, UAPR, USPG, UPH, UTP, UGL, UKET, UBIL, UHGB, UNIT, UROB, ULEU, UEPI, UWBC, URBC, UBAC, CAST, CRYS, UCOM, BILUA,  in the last 72 hours  MICROBIOLOGY: Recent Results (from the past 240 hour(s))  MRSA PCR SCREENING     Status: Abnormal   Collection Time    01/04/14  6:30 PM      Result Value Ref Range Status   MRSA by PCR POSITIVE (*) NEGATIVE Final   Comment:            The GeneXpert MRSA Assay  (FDA     approved for NASAL specimens     only), is one component of a     comprehensive MRSA colonization     surveillance program. It is not     intended to diagnose MRSA     infection nor to guide or     monitor treatment for     MRSA infections.     RESULT CALLED TO, READ BACK BY AND VERIFIED WITHSandrea Matte RN 1155 01/05/11 A BROWNING    RADIOLOGY STUDIES/RESULTS: Reviewed   Eulogio Bear, DO  [facility] Pager:[phone]  If 7PM-7AM, please contact night-coverage [email or web address] Password [email or web address] 01/10/2014, 8:20 AM   LOS: 6 days   **Disclaimer: This note may have been dictated with voice recognition software. Similar sounding words can inadvertently be transcribed and this note may contain transcription errors which may not have been corrected upon publication of note.**

## 2014-01-10 NOTE — Progress Notes (Signed)
Patient ID: Brandi [name], female   DOB: [birth year], 80 y.o.   MRN: [id number]  Subjective: No flatus.  No n/v.  NGT in place.  VSS.  Denies SOB.    Objective:  Vital signs:  Filed Vitals:   01/09/14 2240 01/09/14 2345 01/10/14 0158 01/10/14 0400  BP: 110/69 130/51  116/41  Pulse:  85  86  Temp:  98.1 F (36.7 C)  98.3 F (36.8 C)  TempSrc:  Axillary  Axillary  Resp:  17  12  Height:      Weight:      SpO2:  100% 95% 100%    Last BM Date: 01/09/14  Intake/Output   Yesterday:  07/14 0701 - 07/15 0700 In: 1660 [P.O.:170; I.V.:1500] Out: 700 [Urine:700] This shift:    I/O last 3 completed shifts: In: 2961.7 [P.O.:290; I.V.:2671.7] Out: 2000 [Urine:1800; Emesis/NG output:200]    Physical Exam: General: Pt awake/alert/oriented x4 in no  acute distress. Obese.  Chest: cta.  No chest wall pain w good excursion CV:  Pulses intact.  Regular rhythm Abdomen: Soft.  distended.  Large ventral hernia, unable to reduce, minimally tender.   No evidence of peritonitis.    Problem List:   Active Problems:   DIABETES MELLITUS   Morbid obesity   ANEMIA, IRON DEFICIENCY, CHRONIC   HYPERTENSION   AORTIC STENOSIS, MILD   GERD   Incarcerated ventral hernia (transient)   Dehydration   Acute renal failure   CKD (chronic kidney disease), stage III   Acute respiratory failure with hypoxia   Adynamic ileus    Results:   Labs: Results for orders placed during the hospital encounter of 01/04/14 (from the past 48 hour(s))  GLUCOSE, CAPILLARY     Status: Abnormal   Collection Time    01/08/14  8:30 AM      Result Value Ref Range   Glucose-Capillary 106 (*) 70 - 99 mg/dL  GLUCOSE, CAPILLARY     Status: Abnormal   Collection Time    01/08/14 12:20 PM      Result Value Ref Range   Glucose-Capillary 123 (*) 70 - 99 mg/dL   Comment 1 Documented in Chart     Comment 2 Notify RN    GLUCOSE, CAPILLARY     Status: Abnormal   Collection Time    01/08/14  4:57 PM      Result  Value Ref Range   Glucose-Capillary 104 (*) 70 - 99 mg/dL  GLUCOSE, CAPILLARY     Status: Abnormal   Collection Time    01/08/14  9:32 PM      Result Value Ref Range   Glucose-Capillary 139 (*) 70 - 99 mg/dL   Comment 1 Notify RN    BASIC METABOLIC PANEL     Status: Abnormal   Collection Time    01/09/14  1:05 AM      Result Value Ref Range   Sodium 146  137 - 147 mEq/L   Potassium 4.8  3.7 - 5.3 mEq/L   Chloride 109  96 - 112 mEq/L   CO2 19  19 - 32 mEq/L   Glucose, Bld 145 (*) 70 - 99 mg/dL   BUN 77 (*) 6 - 23 mg/dL   Creatinine, Ser 2.45 (*) 0.50 - 1.10 mg/dL   Calcium 8.4  8.4 - 10.5 mg/dL   GFR calc non Af Amer 17 (*) >90 mL/min   GFR calc Af Amer 20 (*) >90 mL/min   Comment: (NOTE)  The eGFR has been calculated using the CKD EPI equation.     This calculation has not been validated in all clinical situations.     eGFR's persistently <90 mL/min signify possible Chronic Kidney     Disease.   Anion gap 18 (*) 5 - 15  GLUCOSE, CAPILLARY     Status: Abnormal   Collection Time    01/09/14  8:19 AM      Result Value Ref Range   Glucose-Capillary 125 (*) 70 - 99 mg/dL  GLUCOSE, CAPILLARY     Status: Abnormal   Collection Time    01/09/14 12:43 PM      Result Value Ref Range   Glucose-Capillary 157 (*) 70 - 99 mg/dL  GLUCOSE, CAPILLARY     Status: Abnormal   Collection Time    01/09/14  2:28 PM      Result Value Ref Range   Glucose-Capillary 145 (*) 70 - 99 mg/dL  BLOOD GAS, ARTERIAL     Status: Abnormal   Collection Time    01/09/14  2:51 PM      Result Value Ref Range   O2 Content 4.0     Delivery systems NASAL CANNULA     pH, Arterial 7.381  7.350 - 7.450   pCO2 arterial 34.6 (*) 35.0 - 45.0 mmHg   pO2, Arterial 105.0 (*) 80.0 - 100.0 mmHg   Bicarbonate 20.0  20.0 - 24.0 mEq/L   TCO2 21.1  0 - 100 mmol/L   Acid-base deficit 4.2 (*) 0.0 - 2.0 mmol/L   O2 Saturation 98.2     Patient temperature 98.6     Collection site RADIAL     Drawn by [id number]     Sample  type ARTERIAL     Allens test (pass/fail) PASS  PASS  GLUCOSE, CAPILLARY     Status: Abnormal   Collection Time    01/09/14  4:35 PM      Result Value Ref Range   Glucose-Capillary 110 (*) 70 - 99 mg/dL  BLOOD GAS, ARTERIAL     Status: Abnormal   Collection Time    01/09/14  9:12 PM      Result Value Ref Range   O2 Content 2.0     Mode NASAL CANNULA     pH, Arterial 7.357  7.350 - 7.450   pCO2 arterial 36.9  35.0 - 45.0 mmHg   pO2, Arterial 127.0 (*) 80.0 - 100.0 mmHg   Bicarbonate 20.4  20.0 - 24.0 mEq/L   TCO2 21.5  0 - 100 mmol/L   Acid-base deficit 4.3 (*) 0.0 - 2.0 mmol/L   O2 Saturation 98.4     Patient temperature 97.4     Collection site LEFT RADIAL     Drawn by [id number]     Sample type ARTERIAL DRAW     Allens test (pass/fail) PASS  PASS  GLUCOSE, CAPILLARY     Status: Abnormal   Collection Time    01/09/14  9:33 PM      Result Value Ref Range   Glucose-Capillary 123 (*) 70 - 99 mg/dL   Comment 1 Notify RN    CBC     Status: Abnormal   Collection Time    01/10/14  4:43 AM      Result Value Ref Range   WBC 8.5  4.0 - 10.5 K/uL   RBC 3.45 (*) 3.87 - 5.11 MIL/uL   Hemoglobin 10.1 (*) 12.0 - 15.0 g/dL   HCT 31.7 (*) 36.0 -  46.0 %   MCV 91.9  78.0 - 100.0 fL   MCH 29.3  26.0 - 34.0 pg   MCHC 31.9  30.0 - 36.0 g/dL   RDW 15.1  11.5 - 15.5 %   Platelets 271  150 - 400 K/uL  BASIC METABOLIC PANEL     Status: Abnormal   Collection Time    01/10/14  4:43 AM      Result Value Ref Range   Sodium 146  137 - 147 mEq/L   Potassium 4.6  3.7 - 5.3 mEq/L   Chloride 110  96 - 112 mEq/L   CO2 19  19 - 32 mEq/L   Glucose, Bld 96  70 - 99 mg/dL   BUN 73 (*) 6 - 23 mg/dL   Creatinine, Ser 2.44 (*) 0.50 - 1.10 mg/dL   Calcium 8.1 (*) 8.4 - 10.5 mg/dL   GFR calc non Af Amer 17 (*) >90 mL/min   GFR calc Af Amer 20 (*) >90 mL/min   Comment: (NOTE)     The eGFR has been calculated using the CKD EPI equation.     This calculation has not been validated in all clinical  situations.     eGFR's persistently <90 mL/min signify possible Chronic Kidney     Disease.   Anion gap 17 (*) 5 - 15    Imaging / Studies: Ct Abdomen Pelvis Wo Contrast  01/09/2014   CLINICAL DATA:  Dilated colon.  EXAM: CT ABDOMEN AND PELVIS WITHOUT CONTRAST  TECHNIQUE: Multidetector CT imaging of the abdomen and pelvis was performed following the standard protocol without IV contrast.  COMPARISON:  Abdominal radiograph January 09, 2014 and CT of the abdomen pelvis September 25, 2011  FINDINGS: Large body habitus results in overall noisy image quality. Mild motion degraded examination.  LUNG BASES: Included view of the lung bases demonstrates prominent pulmonary vessels, patchy airspace opacities without pleural effusions. The heart appears at least mildly and large, included pericardium is nonsuspicious. The visualized heart and pericardium are unremarkable.  KIDNEYS/BLADDER: Kidneys are orthotopic, demonstrating normal size and morphology. 6 mm right lower pole nephrolithiasis No hydronephrosis; limited assessment for renal masses on this nonenhanced examination. The unopacified ureters are normal in course and caliber. Urinary bladder is decompressed containing a Foley catheter.  SOLID ORGANS: The liver, spleen, pancreas and adrenal glands are unremarkable for this non-contrast examination. Status post cholecystectomy.  GASTROINTESTINAL TRACT: Cecum and ascending colon dilated to at least 10 cm with air-fluid level, colon large broad dilatation to the level of the transverse colon with short-segment entering ventral hernia, with transition point. Hernia neck is 4.3 cm. Small amount of air in the colon distal to the hernia sac. No definite inflammatory changes of the hernia sac though, limited by body habitus. Stomach decompressed with nasogastric tube looped in proximal stomach with distal tip in mid stomach. Small bowel is dilated at 3.9 cm with air-fluid level.  PERITONEUM/RETROPERITONEUM: No intraperitoneal  free fluid nor free air. Aortoiliac vessels are normal in course and caliber, mild calcific atherosclerosis. No lymphadenopathy by CT size criteria. Status post hysterectomy.  SOFT TISSUES/ OSSEOUS STRUCTURES: Nonsuspicious. Suture material along the right anterior abdominal wall. Severe bilateral hip osteoarthrosis. Fatty atrophy of the pelvic muscular tear, recommend correlation with ambulation. Limited assessment of the spine.  IMPRESSION: Incomplete large bowel obstruction, short segment of transverse colon extends into a ventral hernia (4.3 cm neck) with transition point. No CT findings of incarceration.  Partially imaged patchy airspace opacities in right  lung base which could reflect pulmonary edema or even pneumonia. Recommend clinical correlation.   Electronically Signed   By: Elon Alas   On: 01/09/2014 19:22   Dg Chest 1 View  01/09/2014   CLINICAL DATA:  Shortness of breath  EXAM: CHEST - 1 VIEW  COMPARISON:  None.  FINDINGS: A nasogastric catheter is coiled within the stomach. The cardiac shadow is enlarged. The lungs are free of acute infiltrate or sizable effusion. No pneumothorax is. Mild bibasilar atelectatic changes are seen.  IMPRESSION: Nasogastric catheter within the stomach.  Bibasilar atelectasis.   Electronically Signed   By: Inez Catalina M.D.   On: 01/09/2014 19:18   Dg Abd 2 Views  01/09/2014   CLINICAL DATA:  Hernia.  Ileus.  EXAM: ABDOMEN - 2 VIEW  COMPARISON:  01/07/2014  FINDINGS: There is diffuse colonic dilation with air-fluid levels on the decubitus view. Findings are consistent with a diffuse colonic adynamic ileus. A distal colonic obstruction is also possible but felt less likely. There is no free air. Suture material and vascular clips are noted in the right upper quadrant, unchanged. The calcification described on the prior study to the right of the lower lumbar spine is also unchanged most likely a phlebolith.  IMPRESSION: 1. Findings are consistent with a diffuse  colonic adynamic ileus. Colon distention has increased from the prior study. A distal colonic obstruction is not excluded but felt less likely. No free air.   Electronically Signed   By: Lajean Manes M.D.   On: 01/09/2014 09:29   Dg Abd Portable 2v  01/10/2014   CLINICAL DATA:  Abdominal pain; obese patient  EXAM: PORTABLE ABDOMEN - 2 VIEW  COMPARISON:  CT scan of the abdomen and pelvis of 09 January 2014  FINDINGS: The study is quite limited due to the patient's body habitus. There are loops of distended colon present. There are surgical clips in the right upper quadrant. No definite free extraluminal gas is demonstrated.  IMPRESSION: Very limited study demonstrated distended colonic loops consistent with known obstruction.   Electronically Signed   By: David  Martinique   On: 01/10/2014 07:35    Scheduled Meds: . albuterol  2.5 mg Nebulization Q6H  . antiseptic oral rinse  15 mL Mouth Rinse BID  . aspirin EC  81 mg Oral Daily  . budesonide-formoterol  2 puff Inhalation BID  . calcium carbonate  1 tablet Oral TID WC  . darbepoetin  25 mcg Subcutaneous Q14 Days  . fentaNYL  50 mcg Transdermal Q72H  . fluticasone  2 spray Each Nare Daily  . heparin subcutaneous  5,000 Units Subcutaneous 3 times per day  . insulin aspart  0-15 Units Subcutaneous TID WC  . loratadine  10 mg Oral Daily  . metoprolol  5 mg Intravenous 3 times per day  . mupirocin ointment  1 application Nasal BID  . polyethylene glycol  17 g Oral BID  . Vitamin D (Ergocalciferol)  50,000 Units Oral Q7 days   Continuous Infusions: . sodium chloride 150 mL/hr at 01/10/14 0338   PRN Meds:.acetaminophen, albuterol, artificial tears, phenol   Antibiotics: Anti-infectives   None      Assessment/Plan Vental hernia with a colonic obstruction We recommend surgery.  The patient states she is open to surgery if it is absolutely necessary.  Her daughter is not at bedside, but will be arriving at some point today.  I talked to her nurse  to notify us in order to discuss surgery versus  nonoperative/palliative management.  She is at an increased risk of surgical complications given her renal failure, obesity, CHF, COPD.  Continue NPO, NGT decompression.    Erby Pian, [facility] Surgery Pager [phone] Office [phone]  01/10/2014 7:47 AM

## 2014-01-10 NOTE — Transfer of Care (Signed)
Immediate Anesthesia Transfer of Care Note  Patient: Brandi [name]  Procedure(s) Performed: Procedure(s): Exploratory Laparotomy, Lysis of Adhesions, Partial Colectomy, Colostomy and Repair of Incarcerated Hernia (N/A)  Patient Location: PACU  Anesthesia Type:General  Level of Consciousness: awake, alert  and patient cooperative  Airway & Oxygen Therapy: Patient Spontanous Breathing and Patient connected to face mask oxygen  Post-op Assessment: Report given to PACU RN, Post -op Vital signs reviewed and stable and Patient moving all extremities  Post vital signs: Reviewed and stable  Complications: No apparent anesthesia complications

## 2014-01-10 NOTE — Progress Notes (Signed)
I have seen and examined the patient and agree with the assessment and plans. Her chronically incarcerated incisional hernia is definitely worse as far as the proximal colonic distension.  I can reduce the hernia 2/3rd's of the way but not totally.  I explained to her that I am worried that her proximal colon could eventually perforate from distension if the obstruction is not released.  Given her co morbidities, it is risky surgery, but I do not believe she has a choice.  I do not believe this will improve without surgery. Her daughter is coming in to discuss this further.  Curby Carswell A. Ninfa Linden  MD, FACS

## 2014-01-10 NOTE — Progress Notes (Signed)
Received patient from PACU on BIPAP 12/5 40%.  Patient stable, no issues.

## 2014-01-10 NOTE — Anesthesia Procedure Notes (Signed)
Procedure Name: Intubation Date/Time: 01/10/2014 2:11 PM Performed by: Neldon Newport Pre-anesthesia Checklist: Patient identified, Timeout performed, Emergency Drugs available, Suction available and Patient being monitored Patient Re-evaluated:Patient Re-evaluated prior to inductionOxygen Delivery Method: Circle system utilized Preoxygenation: Pre-oxygenation with 100% oxygen Intubation Type: IV induction Ventilation: Mask ventilation without difficulty Laryngoscope Size: Mac and 3 Grade View: Grade I Tube type: Oral Number of attempts: 1 Placement Confirmation: positive ETCO2,  ETT inserted through vocal cords under direct vision and breath sounds checked- equal and bilateral Secured at: 22 cm Tube secured with: Tape Dental Injury: Teeth and Oropharynx as per pre-operative assessment

## 2014-01-10 NOTE — Progress Notes (Signed)
Patient in the PACU with labored, belly breathing. Patient was talkative preoperatively and not appropriate post op. VSS.  Dr. Ermalene Postin called to bedside to evaluate patient, ABG ordered and drawn.  After results of ABG (co2 60.1, po267.4, ph 7.20, BiCarb 23), Dr. Ermalene Postin notified again-patient put on BiPaP. Patient tolerated well. Will continue to monitor patient.

## 2014-01-10 NOTE — Progress Notes (Signed)
Patient ID: Brandi [name], female   DOB: 1928-09-17, 80 y.o.   MRN: [id number]  I discussed the diagnosis with the patient's daughter.  I recommend repair of the incarcerated ventral hernia with possible mesh to prevent possible bowel perforation.  I discussed the risks which include but are not limited to bleeding, infection, prolonged ventilation, need for further surgery, recurrence, cardiac issues, and even death.  She agrees to proceed.  Surgery is scheduled urgently.

## 2014-01-10 NOTE — Clinical Social Work Note (Signed)
CSW has provided 2C with appropriate written handoff. This CSW signing off.  Lubertha Sayres, MSW, [facility] Licensed Clinical Social Worker [phone] and [phone]

## 2014-01-10 NOTE — Op Note (Addendum)
Exploratory Laparotomy, Lysis of Adhesions, End Colostomy and Repair of Incarcerated Hernia  Procedure Note  [name] 01/04/2014 - 01/10/2014   Pre-op Diagnosis: incarcerated incisional hernia     Post-op Diagnosis: incarcerated incisional hernia  Procedure(s): Exploratory Laparotomy, Lysis of Adhesions, Partial Colectomy, Colostomy and Repair of Incarcerated Hernia  Surgeon(s): Harl Bowie, MD Joyice Faster. Cornett, MD  Anesthesia: General  Staff:  Circulator: Norberto Sorenson, RN; Tomma Rakers Sipsis, RN Relief Scrub: Delman Cheadle, CST Scrub Person: Epifanio Lesches Pingue, CST; Lajean Manes, RN Circulator Assistant: Jaci Standard, RN; Quincy Carnes, RN  Estimated Blood Loss: Minimal                         [id number]   Date: 01/10/2014  Time: 3:37 PM

## 2014-01-10 NOTE — Progress Notes (Signed)
Results rec'd on pts repeat ABG, results called to Dr.Smith, okay with pt returning to [facility] and to remain on Bipap with orders for respiratory to wean her

## 2014-01-11 ENCOUNTER — Inpatient Hospital Stay (HOSPITAL_COMMUNITY): Payer: PRIVATE HEALTH INSURANCE

## 2014-01-11 DIAGNOSIS — E119 Type 2 diabetes mellitus without complications: Secondary | ICD-10-CM

## 2014-01-11 LAB — BASIC METABOLIC PANEL
ANION GAP: 17 — AB (ref 5–15)
BUN: 73 mg/dL — ABNORMAL HIGH (ref 6–23)
CALCIUM: 7.4 mg/dL — AB (ref 8.4–10.5)
CO2: 19 mEq/L (ref 19–32)
Chloride: 116 mEq/L — ABNORMAL HIGH (ref 96–112)
Creatinine, Ser: 2.48 mg/dL — ABNORMAL HIGH (ref 0.50–1.10)
GFR, EST AFRICAN AMERICAN: 20 mL/min — AB (ref >90)
GFR, EST NON AFRICAN AMERICAN: 17 mL/min — AB (ref >90)
GLUCOSE: 121 mg/dL — AB (ref 70–99)
Potassium: 4.1 mEq/L (ref 3.7–5.3)
SODIUM: 152 meq/L — AB (ref 137–147)

## 2014-01-11 LAB — CBC
HCT: 31 % — ABNORMAL LOW (ref 36.0–46.0)
Hemoglobin: 9.6 g/dL — ABNORMAL LOW (ref 12.0–15.0)
MCH: 29.6 pg (ref 26.0–34.0)
MCHC: 31 g/dL (ref 30.0–36.0)
MCV: 95.7 fL (ref 78.0–100.0)
PLATELETS: 229 10*3/uL (ref 150–400)
RBC: 3.24 MIL/uL — ABNORMAL LOW (ref 3.87–5.11)
RDW: 15.1 % (ref 11.5–15.5)
WBC: 20 10*3/uL — ABNORMAL HIGH (ref 4.0–10.5)

## 2014-01-11 LAB — GLUCOSE, CAPILLARY
GLUCOSE-CAPILLARY: 127 mg/dL — AB (ref 70–99)
Glucose-Capillary: 115 mg/dL — ABNORMAL HIGH (ref 70–99)
Glucose-Capillary: 132 mg/dL — ABNORMAL HIGH (ref 70–99)
Glucose-Capillary: 125 mg/dL — ABNORMAL HIGH (ref 70–99)

## 2014-01-11 MED ORDER — MORPHINE SULFATE 2 MG/ML IJ SOLN
1.0000 mg | INTRAMUSCULAR | Status: DC | PRN
  Administered 2014-01-12: 2 mg via INTRAVENOUS
  Filled 2014-01-11: qty 1

## 2014-01-11 MED ORDER — SODIUM CHLORIDE 0.45 % IV SOLN
INTRAVENOUS | Status: DC
  Administered 2014-01-11 – 2014-01-12 (×2): 1000 mL via INTRAVENOUS
  Administered 2014-01-13: 15:00:00 via INTRAVENOUS

## 2014-01-11 MED ORDER — ALBUTEROL SULFATE (2.5 MG/3ML) 0.083% IN NEBU
2.5000 mg | INHALATION_SOLUTION | Freq: Three times a day (TID) | RESPIRATORY_TRACT | Status: DC
  Administered 2014-01-11 – 2014-01-15 (×13): 2.5 mg via RESPIRATORY_TRACT
  Filled 2014-01-11 (×13): qty 3

## 2014-01-11 NOTE — Op Note (Signed)
Brandi [name], [name] NO.:  [id number]  MEDICAL RECORD NO.:  [id number]  LOCATION:  2C06C                        FACILITY:  [facility]  PHYSICIAN:  Coralie Keens, M.D. DATE OF BIRTH:  Nov 27, 1928  DATE OF PROCEDURE:  01/10/2014 DATE OF DISCHARGE:                              OPERATIVE REPORT   PREOPERATIVE DIAGNOSIS:  Incarcerated incisional hernia.  POSTOPERATIVE DIAGNOSIS:  Incarcerated incisional hernia.  PROCEDURES: 1. Exploratory laparotomy. 2. Lysis of adhesions. 3. End colostomy. 4. Repair of incarcerated incisional hernia.  SURGEON:  Coralie Keens, M.D.  ASSISTANT:  Joyice Faster. Cornett, M.D.  ANESTHESIA:  General endotracheal anesthesia.  ESTIMATED BLOOD LOSS:  Minimal.  INDICATIONS:  This is an 80 year old female with a known chronic ventral incisional hernia containing colon.  She has become progressively distended.  Her colon has been distended proximally to greater than 10 cm.  A CAT scan demonstrates obstruction of the colon at the level of the hernia, which could not be reduced.  After long discussion with the family, decision was made to proceed with exploration.  FINDINGS:  The patient was indeed found to have a chronic incisional hernia, which was chronically incarcerated.  However, the colon was distended and was consistent more with Ogilvie syndrome, then obstruction from the hernia.  At this point, given the massive distention, the decision was made to proceed with a colostomy for fear of continued Ogilvie syndrome despite repair of the hernia.  The patient also had dense amount of adhesions in the abdominal cavity.  PROCEDURE IN DETAIL:  The patient was brought to the operating room, identified as [facility].  She was placed supine on the operating room table and general anesthesia was induced.  Her abdomen was then prepped and draped in usual sterile fashion.  I created a upper midline incision over the obvious palpable  hernia defect with the scalpel.  I took this down to the hernia sac, which I completely identified.  The sac was then opened and found to contain massively distended transverse colon.  I had to open up the fascia further proximally and distally.  I then had to undertake extensive lysis of adhesions with small bowel tethered as well as omentum tethered to the surrounding peritoneum.  One area of serosal tearing of the small bowel was repaired with interrupted 3-0 silk sutures.  At this point, I tried multiple times to push the air to the proximal and distal colon with no success.  It became quite apparent that this was more of an Ogilvie syndrome as the hernia had been completely reduced and the colon remain massively dilated.  After discussion, I was worried that if we just repair the hernia, the colon would remain massively dilated given her bedrest status, minimal mobilization, etc.  At this point, decision was made to perform a diverting colostomy.  I transected the midtransverse colon with a GIA 75 stapler.  I then placed a silk pursestring suture in the proximal colon. I made a colotomy and suctioned out all the air.  I completely decompressing the proximal colon.  The distal colon appeared decompressed as well.  One small staple hole at the distal segment was closed with a silk suture.  At this  point, I made a separate circular incision in the patient's left abdomen.  I took this down to the fascia, which was opened in a cruciate fashion.  The underlying muscle fiber was then dissected and the peritoneum was opened.  I then pulled out the proximal colon at this part as a colostomy.  At this point, the abdomen was much more decompressed.  I thus closed the patient's midline fascia with a running looped PDS suture.  The skin was then irrigated and closed with skin staples.  I then matured the colostomy circumferentially after excising redundant colon with the cautery.  The patient  tolerated the procedure well.  All the counts were correct at the end of the procedure.  The patient was then taken in stable condition from the operating room to the recovery room.     Coralie Keens, M.D.     DB/MEDQ  D:  01/10/2014  T:  01/11/2014  Job:  [id number]

## 2014-01-11 NOTE — Accreditation Note (Signed)
Colostomy leaking lge amt of dark stool. Colostomy bag changed.

## 2014-01-11 NOTE — Progress Notes (Signed)
1 Day Post-Op  Subjective: Awake, complains of pain  Objective: Vital signs in last 24 hours: Temp:  [97.1 F (36.2 C)-98.2 F (36.8 C)] 97.9 F (36.6 C) (07/16 0427) Pulse Rate:  [80-99] 94 (07/16 0500) Resp:  [14-42] 22 (07/16 0500) BP: (99-162)/(32-79) 132/50 mmHg (07/16 0500) SpO2:  [93 %-100 %] 93 % (07/16 0500) FiO2 (%):  [40 %] 40 % (07/15 2020) Weight:  [356 lb 7.7 oz (161.7 kg)] 356 lb 7.7 oz (161.7 kg) (07/16 0427) Last BM Date: 01/11/14  Intake/Output from previous day: 07/15 0701 - 07/16 0700 In: 1430 [I.V.:1400; NG/GT:30] Out: 1950 [Urine:1000; Emesis/NG output:300; Stool:600; Blood:50] Intake/Output this shift:    Abdomen soft, ostomy already with output, viable Dressing intact, some drainage  Lab Results:   Recent Labs  01/10/14 0443 01/11/14 0359  WBC 8.5 20.0*  HGB 10.1* 9.6*  HCT 31.7* 31.0*  PLT 271 229   BMET  Recent Labs  01/10/14 0443 01/11/14 0359  NA 146 152*  K 4.6 4.1  CL 110 116*  CO2 19 19  GLUCOSE 96 121*  BUN 73* 73*  CREATININE 2.44* 2.48*  CALCIUM 8.1* 7.4*   PT/INR No results found for this basename: LABPROT, INR,  in the last 72 hours ABG  Recent Labs  01/10/14 1641 01/10/14 1845  PHART 7.204* 7.321*  HCO3 23.0 20.8    Studies/Results: Ct Abdomen Pelvis Wo Contrast  01/09/2014   CLINICAL DATA:  Dilated colon.  EXAM: CT ABDOMEN AND PELVIS WITHOUT CONTRAST  TECHNIQUE: Multidetector CT imaging of the abdomen and pelvis was performed following the standard protocol without IV contrast.  COMPARISON:  Abdominal radiograph January 09, 2014 and CT of the abdomen pelvis September 25, 2011  FINDINGS: Large body habitus results in overall noisy image quality. Mild motion degraded examination.  LUNG BASES: Included view of the lung bases demonstrates prominent pulmonary vessels, patchy airspace opacities without pleural effusions. The heart appears at least mildly and large, included pericardium is nonsuspicious. The visualized heart  and pericardium are unremarkable.  KIDNEYS/BLADDER: Kidneys are orthotopic, demonstrating normal size and morphology. 6 mm right lower pole nephrolithiasis No hydronephrosis; limited assessment for renal masses on this nonenhanced examination. The unopacified ureters are normal in course and caliber. Urinary bladder is decompressed containing a Foley catheter.  SOLID ORGANS: The liver, spleen, pancreas and adrenal glands are unremarkable for this non-contrast examination. Status post cholecystectomy.  GASTROINTESTINAL TRACT: Cecum and ascending colon dilated to at least 10 cm with air-fluid level, colon large broad dilatation to the level of the transverse colon with short-segment entering ventral hernia, with transition point. Hernia neck is 4.3 cm. Small amount of air in the colon distal to the hernia sac. No definite inflammatory changes of the hernia sac though, limited by body habitus. Stomach decompressed with nasogastric tube looped in proximal stomach with distal tip in mid stomach. Small bowel is dilated at 3.9 cm with air-fluid level.  PERITONEUM/RETROPERITONEUM: No intraperitoneal free fluid nor free air. Aortoiliac vessels are normal in course and caliber, mild calcific atherosclerosis. No lymphadenopathy by CT size criteria. Status post hysterectomy.  SOFT TISSUES/ OSSEOUS STRUCTURES: Nonsuspicious. Suture material along the right anterior abdominal wall. Severe bilateral hip osteoarthrosis. Fatty atrophy of the pelvic muscular tear, recommend correlation with ambulation. Limited assessment of the spine.  IMPRESSION: Incomplete large bowel obstruction, short segment of transverse colon extends into a ventral hernia (4.3 cm neck) with transition point. No CT findings of incarceration.  Partially imaged patchy airspace opacities in right lung  base which could reflect pulmonary edema or even pneumonia. Recommend clinical correlation.   Electronically Signed   By: Elon Alas   On: 01/09/2014 19:22    Dg Chest 1 View  01/09/2014   CLINICAL DATA:  Shortness of breath  EXAM: CHEST - 1 VIEW  COMPARISON:  None.  FINDINGS: A nasogastric catheter is coiled within the stomach. The cardiac shadow is enlarged. The lungs are free of acute infiltrate or sizable effusion. No pneumothorax is. Mild bibasilar atelectatic changes are seen.  IMPRESSION: Nasogastric catheter within the stomach.  Bibasilar atelectasis.   Electronically Signed   By: Inez Catalina M.D.   On: 01/09/2014 19:18   Dg Abd 2 Views  01/09/2014   CLINICAL DATA:  Hernia.  Ileus.  EXAM: ABDOMEN - 2 VIEW  COMPARISON:  01/07/2014  FINDINGS: There is diffuse colonic dilation with air-fluid levels on the decubitus view. Findings are consistent with a diffuse colonic adynamic ileus. A distal colonic obstruction is also possible but felt less likely. There is no free air. Suture material and vascular clips are noted in the right upper quadrant, unchanged. The calcification described on the prior study to the right of the lower lumbar spine is also unchanged most likely a phlebolith.  IMPRESSION: 1. Findings are consistent with a diffuse colonic adynamic ileus. Colon distention has increased from the prior study. A distal colonic obstruction is not excluded but felt less likely. No free air.   Electronically Signed   By: Lajean Manes M.D.   On: 01/09/2014 09:29   Dg Abd Portable 2v  01/10/2014   CLINICAL DATA:  Abdominal pain; obese patient  EXAM: PORTABLE ABDOMEN - 2 VIEW  COMPARISON:  CT scan of the abdomen and pelvis of 09 January 2014  FINDINGS: The study is quite limited due to the patient's body habitus. There are loops of distended colon present. There are surgical clips in the right upper quadrant. No definite free extraluminal gas is demonstrated.  IMPRESSION: Very limited study demonstrated distended colonic loops consistent with known obstruction.   Electronically Signed   By: David  Martinique   On: 01/10/2014 07:35     Anti-infectives: Anti-infectives   Start     Dose/Rate Route Frequency Ordered Stop   01/10/14 1400  ceFAZolin (ANCEF) IVPB 2 g/50 mL premix  Status:  Discontinued     2 g 100 mL/hr over 30 Minutes Intravenous 3 times per day 01/10/14 1223 01/10/14 1243   01/10/14 1400  [MAR Hold]  ceFAZolin (ANCEF) 3 g in dextrose 5 % 50 mL IVPB     (On MAR Hold since 01/10/14 1328)   3 g 160 mL/hr over 30 Minutes Intravenous On call to O.R. 01/10/14 1245 01/10/14 1415      Assessment/Plan: s/p Procedure(s): Exploratory Laparotomy, Lysis of Adhesions, Partial Colectomy, Colostomy and Repair of Incarcerated Hernia (N/A)  Will keep NG at least one more day Continue current care  LOS: 7 days    Audwin Semper A 01/11/2014

## 2014-01-11 NOTE — Anesthesia Postprocedure Evaluation (Signed)
  Anesthesia Post-op Note  Patient: Brandi [name]  Procedure(s) Performed: Procedure(s): Exploratory Laparotomy, Lysis of Adhesions, Partial Colectomy, Colostomy and Repair of Incarcerated Hernia (N/A)  Patient Location: PACU  Anesthesia Type:General  Level of Consciousness: patient arousable, patient mental status improved following application fo BiPAP in the initial setting of hypoventilation  Airway and Oxygen Therapy: Patient Spontanous Breathing, applied BiPAP in setting of hypoventilation, stable respiratory effort and saturation  Post-op Pain: mild  Post-op Assessment: Post-op Vital signs reviewed, Patient's Cardiovascular Status Stable, Patent Airway and No signs of Nausea or vomiting  Post-op Vital Signs: Reviewed and stable  Last Vitals:  Filed Vitals:   01/11/14 0837  BP: 100/35  Pulse: 95  Temp: 37.2 C  Resp: 25    Complications: No apparent anesthesia complications

## 2014-01-11 NOTE — Clinical Social Work Note (Signed)
Clinical Social Worker continuing to follow patient and family for support and discharge planning needs.  Patient is from [facility] and will return at discharge once medically stable.  CSW remains available for support and to facilitate patient discharge needs.  Barbette Or, [place]

## 2014-01-11 NOTE — Progress Notes (Signed)
PATIENT DETAILS Name: Brandi [name] Age: 80 y.o. Sex: female Date of Birth: April 06, 1929 Admit Date: 01/04/2014 Admitting Physician Thurnell Lose, MD JJH:[id number], Javier Glazier, MD   Brief narrative:  80 year old morbidly obese female patient w/ a long-standing ventral hernia and recurrent abdominal pain. Patient was initially sent to [facility] from her skilled nursing facility after worsening abdominal pain over 24-48 hours. Imaging done at that time was concerning for possible incarcerated hernia. Because of her high-risk status for future surgical procedures she was refused admission by general surgery at that facility. Her case and clinical findings were discussed with General Surgery at [facility] and the patient was subsequently accepted to this hospital for medical admission with surgical consultation.   After arrival she was also evaluated by General Surgery. It was noted that prior to arrival the patient passed a large BM and expelled significant amount of flatus with improvement in her abdominal exam with decreased tenderness. It was felt that there was no evidence of ischemia and no evidence of obstruction.She was given supporitve care, and eventually transferred out of SDU on 7/13, however on 7/14-she was noted to be Short of breath, had worsening ileus on Xray, NGT was placed, patient is now being transferred back to SDU for closer monitoring.  Surgery tentatively planned for 7/15 s/p ex lap with lysis of adhesions and colostomy  Subjective: Asking for food to eat C/o being "sore"  Assessment/Plan:  Acute on chronic Resp Failure-hypoxia -wean O2 as tolerated DNR reconfirmed with daughter. Added incentive spirometry -Supportive care for now -if developes fever, will add abx  Hypernatremia -change IVF to 1/2 NS -monitor  Leukocytosis -monitor  Incarcerated ventral hernia (transient)/ Adynamic ileus- s/p ex lap with lysis of adhesions and colostomy -general  surgery following -NG tube at least 1 more day  Dehydration  -continue IVF at a lower rate  Acute renal failure on Chronic kidney disease, stage III  -Baseline BUN and creatinine dating back to 2013 24/1.73 - avoid hypotension - avoid nephrotoxic medication  -family understands patient a poor candidate for Dialysis. Family agreeable with placement of PICC line as they likely will not pursue HD in the future  DIABETES MELLITUS  -CBGs stable-c/w SSI  ANEMIA, IRON DEFICIENCY, CHRONIC  -Baseline hemoglobin around 10.8 as of 2013 - after admission Hgb drifted down to 6.8 - transfused 2 units packed red blood cells - Hgb stable for now - cont to follow   Hx of HYPERTENSION  Blood pressure now elevated--as NPO-start IV Lopressor  Protein calorie malnutrition  Albumin level I.7 with a total protein of 4.8   Morbid obesity - Body mass index is 61.08 kg/(m^2).   AORTIC STENOSIS, MILD   GERD   Disposition: Remain inpatient  DVT Prophylaxis: Prophylactic Heparin   Code Status: DNR  Family Communication Brenda-[phone]  Procedures:  None  CONSULTS:  general surgery  Time spent 35 minutes-which includes 50% of the time with face-to-face with patient/ family and coordinating care related to the above assessment and plan.  MEDICATIONS: Scheduled Meds: . albuterol  2.5 mg Nebulization Q6H  . antiseptic oral rinse  15 mL Mouth Rinse BID  . aspirin EC  81 mg Oral Daily  . budesonide-formoterol  2 puff Inhalation BID  . calcium carbonate  1 tablet Oral TID WC  . darbepoetin  25 mcg Subcutaneous Q14 Days  . fentaNYL  50 mcg Transdermal Q72H  . fluticasone  2 spray Each Nare Daily  .  heparin subcutaneous  5,000 Units Subcutaneous 3 times per day  . insulin aspart  0-15 Units Subcutaneous TID WC  . loratadine  10 mg Oral Daily  . metoprolol  5 mg Intravenous 3 times per day  . Vitamin D (Ergocalciferol)  50,000 Units Oral Q7 days   Continuous Infusions: .  sodium chloride     PRN Meds:.acetaminophen, albuterol, artificial tears, morphine injection, phenol  Antibiotics: Anti-infectives   Start     Dose/Rate Route Frequency Ordered Stop   01/10/14 1400  ceFAZolin (ANCEF) IVPB 2 g/50 mL premix  Status:  Discontinued     2 g 100 mL/hr over 30 Minutes Intravenous 3 times per day 01/10/14 1223 01/10/14 1243   01/10/14 1400  [MAR Hold]  ceFAZolin (ANCEF) 3 g in dextrose 5 % 50 mL IVPB     (On MAR Hold since 01/10/14 1328)   3 g 160 mL/hr over 30 Minutes Intravenous On call to O.R. 01/10/14 1245 01/10/14 1415       PHYSICAL EXAM: Vital signs in last 24 hours: Filed Vitals:   01/11/14 0156 01/11/14 0427 01/11/14 0455 01/11/14 0500  BP:   115/41 132/50  Pulse:   99 94  Temp:  97.9 F (36.6 C)    TempSrc:  Axillary    Resp:   24 22  Height:      Weight:  161.7 kg (356 lb 7.7 oz)    SpO2: 98%  99% 93%    Weight change:  Filed Weights   01/05/14 0316 01/06/14 0530 01/11/14 0427  Weight: 157.398 kg (347 lb) 161.481 kg (356 lb) 161.7 kg (356 lb 7.7 oz)   Body mass index is 61.16 kg/(m^2).   Gen Exam: Awake, answers questions Neck: Supple, No JVD.   Chest: B/L Clear.  Poor air entry CVS: S1 S2 Regular, + murmurs.  Abdomen: soft, very obese belly-colosctomy  Extremities: 2+ edema, lower extremities warm to touch. Neurologic: Poor exam-but appears non focal Skin: No Rash.   Wounds: N/A.    Intake/Output from previous day:  Intake/Output Summary (Last 24 hours) at 01/11/14 0806 Last data filed at 01/11/14 0500  Gross per 24 hour  Intake   1430 ml  Output   1950 ml  Net   -520 ml     LAB RESULTS: CBC  Recent Labs Lab 01/04/14 2229 01/05/14 0419  01/05/14 2332 01/07/14 0355 01/08/14 0005 01/10/14 0443 01/11/14 0359  WBC 15.3* 13.1*  < > 11.7* 9.6 9.6 8.5 20.0*  HGB 7.4* 6.8*  < > 9.4* 9.2* 9.9* 10.1* 9.6*  HCT 23.1* 21.2*  < > 29.5* 29.0* 31.4* 31.7* 31.0*  PLT 221 200  < > 189 231 256 271 229  MCV 91.7 91.4  < >  92.5 91.8 91.8 91.9 95.7  MCH 29.4 29.3  < > 29.5 29.1 28.9 29.3 29.6  MCHC 32.0 32.1  < > 31.9 31.7 31.5 31.9 31.0  RDW 13.8 14.0  < > 14.6 15.1 15.0 15.1 15.1  LYMPHSABS 0.8 0.8  --   --   --   --   --   --   MONOABS 1.1* 1.0  --   --   --   --   --   --   EOSABS 0.2 0.3  --   --   --   --   --   --   BASOSABS 0.0 0.0  --   --   --   --   --   --   < > =  values in this interval not displayed.  Chemistries   Recent Labs Lab 01/07/14 0355 01/08/14 0005 01/09/14 0105 01/10/14 0443 01/11/14 0359  NA 143 144 146 146 152*  K 5.7* 4.9 4.8 4.6 4.1  CL 107 107 109 110 116*  CO2 20 20 19 19 19   GLUCOSE 99 115* 145* 96 121*  BUN 82* 80* 77* 73* 73*  CREATININE 2.86* 2.64* 2.45* 2.44* 2.48*  CALCIUM 8.0* 8.4 8.4 8.1* 7.4*    CBG:  Recent Labs Lab 01/09/14 2133 01/10/14 0759 01/10/14 1221 01/10/14 1610 01/10/14 2140  GLUCAP 123* 96 94 111* 127*    GFR Estimated Creatinine Clearance: 26.5 ml/min (by C-G formula based on Cr of 2.48).  Coagulation profile No results found for this basename: INR, PROTIME,  in the last 168 hours  Cardiac Enzymes  Recent Labs Lab 01/04/14 2229 01/05/14 0419 01/05/14 0800  TROPONINI <0.30 <0.30 <0.30    No components found with this basename: POCBNP,  No results found for this basename: DDIMER,  in the last 72 hours No results found for this basename: HGBA1C,  in the last 72 hours No results found for this basename: CHOL, HDL, LDLCALC, TRIG, CHOLHDL, LDLDIRECT,  in the last 72 hours No results found for this basename: TSH, T4TOTAL, FREET3, T3FREE, THYROIDAB,  in the last 72 hours No results found for this basename: VITAMINB12, FOLATE, FERRITIN, TIBC, IRON, RETICCTPCT,  in the last 72 hours No results found for this basename: LIPASE, AMYLASE,  in the last 72 hours  Urine Studies No results found for this basename: UACOL, UAPR, USPG, UPH, UTP, UGL, UKET, UBIL, UHGB, UNIT, UROB, ULEU, UEPI, UWBC, URBC, UBAC, CAST, CRYS, UCOM, BILUA,  in  the last 72 hours  MICROBIOLOGY: Recent Results (from the past 240 hour(s))  MRSA PCR SCREENING     Status: Abnormal   Collection Time    01/04/14  6:30 PM      Result Value Ref Range Status   MRSA by PCR POSITIVE (*) NEGATIVE Final   Comment:            The GeneXpert MRSA Assay (FDA     approved for NASAL specimens     only), is one component of a     comprehensive MRSA colonization     surveillance program. It is not     intended to diagnose MRSA     infection nor to guide or     monitor treatment for     MRSA infections.     RESULT CALLED TO, READ BACK BY AND VERIFIED WITHSandrea Matte RN [phone] 01/05/11 A BROWNING    RADIOLOGY STUDIES/RESULTS: Reviewed   Eulogio Bear, DO  [facility] Pager:[phone]  If 7PM-7AM, please contact night-coverage [email or web address] Password [email or web address] 01/11/2014, 8:06 AM   LOS: 7 days   **Disclaimer: This note may have been dictated with voice recognition software. Similar sounding words can inadvertently be transcribed and this note may contain transcription errors which may not have been corrected upon publication of note.**

## 2014-01-12 ENCOUNTER — Encounter (HOSPITAL_COMMUNITY): Payer: Self-pay | Admitting: Surgery

## 2014-01-12 DIAGNOSIS — E870 Hyperosmolality and hypernatremia: Secondary | ICD-10-CM

## 2014-01-12 LAB — BASIC METABOLIC PANEL
Anion gap: 14 (ref 5–15)
BUN: 77 mg/dL — AB (ref 6–23)
CO2: 20 meq/L (ref 19–32)
CREATININE: 3 mg/dL — AB (ref 0.50–1.10)
Calcium: 7.2 mg/dL — ABNORMAL LOW (ref 8.4–10.5)
Chloride: 115 mEq/L — ABNORMAL HIGH (ref 96–112)
GFR calc Af Amer: 16 mL/min — ABNORMAL LOW (ref >90)
GFR calc non Af Amer: 13 mL/min — ABNORMAL LOW (ref >90)
GLUCOSE: 120 mg/dL — AB (ref 70–99)
Potassium: 3.8 mEq/L (ref 3.7–5.3)
Sodium: 149 mEq/L — ABNORMAL HIGH (ref 137–147)

## 2014-01-12 LAB — CBC
HEMATOCRIT: 25.6 % — AB (ref 36.0–46.0)
HEMOGLOBIN: 8.1 g/dL — AB (ref 12.0–15.0)
MCH: 29.9 pg (ref 26.0–34.0)
MCHC: 31.6 g/dL (ref 30.0–36.0)
MCV: 94.5 fL (ref 78.0–100.0)
Platelets: 197 10*3/uL (ref 150–400)
RBC: 2.71 MIL/uL — ABNORMAL LOW (ref 3.87–5.11)
RDW: 15.9 % — ABNORMAL HIGH (ref 11.5–15.5)
WBC: 23 10*3/uL — AB (ref 4.0–10.5)

## 2014-01-12 LAB — GLUCOSE, CAPILLARY
Glucose-Capillary: 113 mg/dL — ABNORMAL HIGH (ref 70–99)
Glucose-Capillary: 126 mg/dL — ABNORMAL HIGH (ref 70–99)
Glucose-Capillary: 158 mg/dL — ABNORMAL HIGH (ref 70–99)

## 2014-01-12 MED ORDER — BOOST / RESOURCE BREEZE PO LIQD
1.0000 | Freq: Three times a day (TID) | ORAL | Status: DC
  Administered 2014-01-12 – 2014-01-14 (×5): 1 via ORAL

## 2014-01-12 NOTE — Progress Notes (Signed)
NUTRITION FOLLOW UP  DOCUMENTATION CODES Per approved criteria  -Morbid Obesity   Intervention:  Clear Liquid diet Resource Breeze po TID, each supplement provides 250 kcal and 9 grams of protein RD to follow for nutrition care plan  New Nutrition Dx: Increased nutrient needs related to post-op healing as evidenced by estimated nutrition needs, ongoing  Goal: Patient will meet >/=90% of estimated nutrition needs, currently unmet  Monitor:  PO & supplemental intake, weight, labs, I/O's  ASSESSMENT: 80 y.o. year old female multiple medical problems including morbid obesity, bed bound status, HTN, DM, CKD, HLD, ventral hernia presenting with abd pain, Incarcerated ventral hernia. Pt is a SNF resident near [facility]. Per report from grandson, patient has had recurrent abdominal pain over the past several months related to a ventral hernia. She had acute worsening of abdominal pain over the past 24-48 hours.   Patient s/p procedures 7/15: EXPLORATORY LAPAROTOMY LYSIS OF ADHESIONS PARTIAL COLECTOMY COLOSTOMY REPAIR OF INCARCERATED HERNIA  Patient remains NPO post-op.  NGT out.  Passing some stool into colostomy bag.  No reported nausea.  CWOCN note reviewed 7/17.  S/p bedside swallow evaluation today -- SLP recommending Clear Liquid diet.  Telephone with readback order received per Saverio Danker, PA to initiate Clear Liquid diet.  Height: Ht Readings from Last 1 Encounters:  01/05/14 5\' 4"  (1.626 m)    Weight: Wt Readings from Last 1 Encounters:  01/11/14 356 lb 7.7 oz (161.7 kg)    BMI:  Body mass index is 61.16 kg/(m^2).   Estimated Nutritional Needs: Kcal: 2050-2250 kcal Protein: 115-125 g Fluid: >2.9 L/day  Skin: Intact  Diet Order: NPO   Intake/Output Summary (Last 24 hours) at 01/12/14 1141 Last data filed at 01/12/14 0300  Gross per 24 hour  Intake  917.5 ml  Output    620 ml  Net  297.5 ml    Labs:   Recent Labs Lab 01/10/14 0443  01/11/14 0359 01/12/14 0810  NA 146 152* 149*  K 4.6 4.1 3.8  CL 110 116* 115*  CO2 19 19 20   BUN 73* 73* 77*  CREATININE 2.44* 2.48* 3.00*  CALCIUM 8.1* 7.4* 7.2*  GLUCOSE 96 121* 120*    CBG (last 3)   Recent Labs  01/11/14 1534 01/11/14 2105 01/12/14 0729  GLUCAP 132* 125* 113*    Scheduled Meds: . albuterol  2.5 mg Nebulization TID  . antiseptic oral rinse  15 mL Mouth Rinse BID  . aspirin EC  81 mg Oral Daily  . budesonide-formoterol  2 puff Inhalation BID  . calcium carbonate  1 tablet Oral TID WC  . darbepoetin  25 mcg Subcutaneous Q14 Days  . fentaNYL  50 mcg Transdermal Q72H  . fluticasone  2 spray Each Nare Daily  . heparin subcutaneous  5,000 Units Subcutaneous 3 times per day  . insulin aspart  0-15 Units Subcutaneous TID WC  . loratadine  10 mg Oral Daily  . metoprolol  5 mg Intravenous 3 times per day  . Vitamin D (Ergocalciferol)  50,000 Units Oral Q7 days    Continuous Infusions: . sodium chloride 700 mL (01/11/14 1330)    Past Medical History  Diagnosis Date  . CHF (congestive heart failure)   . COPD (chronic obstructive pulmonary disease)   . Hypertension   . Diabetes mellitus   . Renal disorder   . Schmorl's nodes   . Muscle weakness   . Chronic pain   . GERD (gastroesophageal reflux disease)   .  Chronic bronchitis   . Hypoxemia   . Depression   . Anemia   . Morbid obesity   . Shortness of breath   . DVT (deep venous thrombosis)     Past Surgical History  Procedure Laterality Date  . Cholecystectomy    . Abdominal hysterectomy    . Incisional hernia repair N/A 01/10/2014    Procedure: Exploratory Laparotomy, Lysis of Adhesions, Partial Colectomy, Colostomy and Repair of Incarcerated Hernia;  Surgeon: Harl Bowie, MD;  Location: [place];  Service: General;  Laterality: N/A;    Arthur Holms, RD, LDN Pager #: [phone] After-Hours Pager #: [phone]

## 2014-01-12 NOTE — Progress Notes (Signed)
I have seen and examined the patient and agree with the assessment and plans. Ostomy working well.  Will leave NG out  Keighan Amezcua A. Ninfa Linden  MD, FACS

## 2014-01-12 NOTE — Consult Note (Addendum)
WOC ostomy consult note Stoma type/location:  Pt received colostomy to left lower quad on 7/15.  Pouch has leaked into abd wound site several times since surgery, according to staff. Stomal assessment/size: Stoma 50% necrotic, 50% red, slightly above skin level, 2 inches and oval Peristomal assessment: intact skin surrounding. Output 50cc dark brown thick liquid stool. Ostomy pouching: 1pc. with barrier ring. Education provided: Pt not very awake and not ready for teaching session.  Will begin education after stable and out of ICU.  No family members at bedside.  Educational book left at bedside.  Applied one piece pouch with barrier ring to maintain seal.  Stoma is large and pouch must be cut to the edge of the boarder on the pouch barrier.  Convex pouches may be more effective and ordered to bedside assist with maintaining seal.  Julien Girt MSN, [place], [place], [place], [place]

## 2014-01-12 NOTE — Progress Notes (Signed)
PATIENT DETAILS Name: Brandi [name] Age: 80 y.o. Sex: female Date of Birth: September 18, 1928 Admit Date: 01/04/2014 Admitting Physician Thurnell Lose, MD [id number], Javier Glazier, MD   Brief narrative:  80 year old morbidly obese female patient w/ a long-standing ventral hernia and recurrent abdominal pain. Patient was initially sent to [facility] from her skilled nursing facility after worsening abdominal pain over 24-48 hours. Imaging done at that time was concerning for possible incarcerated hernia. Because of her high-risk status for future surgical procedures she was refused admission by general surgery at that facility. Her case and clinical findings were discussed with General Surgery at [facility] and the patient was subsequently accepted to this hospital for medical admission with surgical consultation.   After arrival she was also evaluated by General Surgery. It was noted that prior to arrival the patient passed a large BM and expelled significant amount of flatus with improvement in her abdominal exam with decreased tenderness. It was felt that there was no evidence of ischemia and no evidence of obstruction.She was given supporitve care, and eventually transferred out of SDU on 7/13, however on 7/14-she was noted to be Short of breath, had worsening ileus on Xray, NGT was placed, patient is now being transferred back to SDU for closer monitoring.  Surgery  Done 7/15 s/p ex lap with lysis of adhesions and colostomy  Subjective: Pulled NG tube last night No nausea/vomiting Wants something to drink  Assessment/Plan:  Acute on chronic Resp Failure-hypoxia -wean O2 as tolerated DNR  Added incentive spirometry -Supportive care for now -if developes fever, will add abx  Hypernatremia -change IVF to 1/2 NS -monitor- await today's labs  Leukocytosis -monitor  Incarcerated ventral hernia (transient)/ Adynamic ileus- s/p ex lap with lysis of adhesions and  colostomy -general surgery following -SLP eval and liquid diet to start if patient passes  Dehydration  -continue IVF at a lower rate  Acute renal failure on Chronic kidney disease, stage III  -Baseline BUN and creatinine dating back to 2013 24/1.73 - avoid hypotension - avoid nephrotoxic medication  -family understands patient a poor candidate for Dialysis. Family agreeable with placement of PICC line as they likely will not pursue HD in the future  DIABETES MELLITUS  -CBGs stable-c/w SSI  ANEMIA, IRON DEFICIENCY, CHRONIC  -Baseline hemoglobin around 10.8 as of 2013 - after admission Hgb drifted down to 6.8 - transfused 2 units packed red blood cells - Hgb stable for now - cont to follow   Hx of HYPERTENSION  Blood pressure now elevated--as NPO-start IV Lopressor  Protein calorie malnutrition  Albumin level I.7 with a total protein of 4.8   Morbid obesity - Body mass index is 61.08 kg/(m^2).   AORTIC STENOSIS, MILD   GERD   Disposition: Remain inpatient  DVT Prophylaxis: Prophylactic Heparin   Code Status: DNR  Family Communication Brenda-[phone]- sent to VM- did not leave VM as unmarked  From SNF Procedures:  None  CONSULTS:  general surgery  Time spent 35 minutes-which includes 50% of the time with face-to-face with patient/ family and coordinating care related to the above assessment and plan.  MEDICATIONS: Scheduled Meds: . albuterol  2.5 mg Nebulization TID  . antiseptic oral rinse  15 mL Mouth Rinse BID  . aspirin EC  81 mg Oral Daily  . budesonide-formoterol  2 puff Inhalation BID  . calcium carbonate  1 tablet Oral TID WC  . darbepoetin  25 mcg Subcutaneous Q14 Days  .  fentaNYL  50 mcg Transdermal Q72H  . fluticasone  2 spray Each Nare Daily  . heparin subcutaneous  5,000 Units Subcutaneous 3 times per day  . insulin aspart  0-15 Units Subcutaneous TID WC  . loratadine  10 mg Oral Daily  . metoprolol  5 mg Intravenous 3 times per  day  . Vitamin D (Ergocalciferol)  50,000 Units Oral Q7 days   Continuous Infusions: . sodium chloride 700 mL (01/11/14 1330)   PRN Meds:.acetaminophen, albuterol, artificial tears, morphine injection, phenol  Antibiotics: Anti-infectives   Start     Dose/Rate Route Frequency Ordered Stop   01/10/14 1400  ceFAZolin (ANCEF) IVPB 2 g/50 mL premix  Status:  Discontinued     2 g 100 mL/hr over 30 Minutes Intravenous 3 times per day 01/10/14 1223 01/10/14 1243   01/10/14 1400  [MAR Hold]  ceFAZolin (ANCEF) 3 g in dextrose 5 % 50 mL IVPB     (On MAR Hold since 01/10/14 1328)   3 g 160 mL/hr over 30 Minutes Intravenous On call to O.R. 01/10/14 1245 01/10/14 1415       PHYSICAL EXAM: Vital signs in last 24 hours: Filed Vitals:   01/12/14 0303 01/12/14 0500 01/12/14 0730 01/12/14 0755  BP: 102/51  113/84   Pulse: 90  89   Temp:  97.4 F (36.3 C) 98.8 F (37.1 C)   TempSrc:  Oral Oral   Resp: 15  16   Height:      Weight:      SpO2: 99%  100% 98%    Weight change:  Filed Weights   01/05/14 0316 01/06/14 0530 01/11/14 0427  Weight: 157.398 kg (347 lb) 161.481 kg (356 lb) 161.7 kg (356 lb 7.7 oz)   Body mass index is 61.16 kg/(m^2).   Gen Exam: Awake, answers questions Neck: Supple, No JVD.   Chest: B/L Clear.  Poor air entry CVS: S1 S2 Regular, + murmurs.  Abdomen: soft, very obese belly-colosctomy  Extremities: 2+ edema, lower extremities warm to touch. Neurologic: Poor exam-but appears non focal Skin: No Rash.   Wounds: N/A.    Intake/Output from previous day:  Intake/Output Summary (Last 24 hours) at 01/12/14 0822 Last data filed at 01/12/14 0300  Gross per 24 hour  Intake  917.5 ml  Output    665 ml  Net  252.5 ml     LAB RESULTS: CBC  Recent Labs Lab 01/05/14 2332 01/07/14 0355 01/08/14 0005 01/10/14 0443 01/11/14 0359  WBC 11.7* 9.6 9.6 8.5 20.0*  HGB 9.4* 9.2* 9.9* 10.1* 9.6*  HCT 29.5* 29.0* 31.4* 31.7* 31.0*  PLT 189 231 256 271 229  MCV  92.5 91.8 91.8 91.9 95.7  MCH 29.5 29.1 28.9 29.3 29.6  MCHC 31.9 31.7 31.5 31.9 31.0  RDW 14.6 15.1 15.0 15.1 15.1    Chemistries   Recent Labs Lab 01/07/14 0355 01/08/14 0005 01/09/14 0105 01/10/14 0443 01/11/14 0359  NA 143 144 146 146 152*  K 5.7* 4.9 4.8 4.6 4.1  CL 107 107 109 110 116*  CO2 20 20 19 19 19   GLUCOSE 99 115* 145* 96 121*  BUN 82* 80* 77* 73* 73*  CREATININE 2.86* 2.64* 2.45* 2.44* 2.48*  CALCIUM 8.0* 8.4 8.4 8.1* 7.4*    CBG:  Recent Labs Lab 01/11/14 0838 01/11/14 1202 01/11/14 1534 01/11/14 2105 01/12/14 0729  GLUCAP 115* 127* 132* 125* 113*    GFR Estimated Creatinine Clearance: 26.5 ml/min (by C-G formula based on Cr of 2.48).  Coagulation profile No results found for this basename: INR, PROTIME,  in the last 168 hours  Cardiac Enzymes No results found for this basename: CK, CKMB, TROPONINI, MYOGLOBIN,  in the last 168 hours  No components found with this basename: POCBNP,  No results found for this basename: DDIMER,  in the last 72 hours No results found for this basename: HGBA1C,  in the last 72 hours No results found for this basename: CHOL, HDL, LDLCALC, TRIG, CHOLHDL, LDLDIRECT,  in the last 72 hours No results found for this basename: TSH, T4TOTAL, FREET3, T3FREE, THYROIDAB,  in the last 72 hours No results found for this basename: VITAMINB12, FOLATE, FERRITIN, TIBC, IRON, RETICCTPCT,  in the last 72 hours No results found for this basename: LIPASE, AMYLASE,  in the last 72 hours  Urine Studies No results found for this basename: UACOL, UAPR, USPG, UPH, UTP, UGL, UKET, UBIL, UHGB, UNIT, UROB, ULEU, UEPI, UWBC, URBC, UBAC, CAST, CRYS, UCOM, BILUA,  in the last 72 hours  MICROBIOLOGY: Recent Results (from the past 240 hour(s))  MRSA PCR SCREENING     Status: Abnormal   Collection Time    01/04/14  6:30 PM      Result Value Ref Range Status   MRSA by PCR POSITIVE (*) NEGATIVE Final   Comment:            The GeneXpert MRSA  Assay (FDA     approved for NASAL specimens     only), is one component of a     comprehensive MRSA colonization     surveillance program. It is not     intended to diagnose MRSA     infection nor to guide or     monitor treatment for     MRSA infections.     RESULT CALLED TO, READ BACK BY AND VERIFIED WITHSandrea Matte RN [phone] 01/05/11 A BROWNING    RADIOLOGY STUDIES/RESULTS: Reviewed   Eulogio Bear, DO  [facility] Pager:[phone]  If 7PM-7AM, please contact night-coverage [email or web address] Password [email or web address] 01/12/2014, 8:22 AM   LOS: 8 days   **Disclaimer: This note may have been dictated with voice recognition software. Similar sounding words can inadvertently be transcribed and this note may contain transcription errors which may not have been corrected upon publication of note.**

## 2014-01-12 NOTE — Clinical Social Work Note (Signed)
Clinical Social Worker continuing to follow patient and family for support and discharge planning needs. Patient is from [facility] and will return at discharge once medically stable.  CSW spoke with facility to provide updated medical status and hopeful plans for return first of next week.  Facility appreciative and agreeable with return. CSW remains available for support and to facilitate patient discharge needs once medically ready.   Brandi [name], [place]

## 2014-01-12 NOTE — Evaluation (Signed)
Clinical/Bedside Swallow Evaluation Patient Details  Name: Brandi [name] MRN: [id number] Date of Birth: 10/17/1928  Today's Date: 01/12/2014 Time: 6803-2122 SLP Time Calculation (min): 21 min  Past Medical History:  Past Medical History  Diagnosis Date  . CHF (congestive heart failure)   . COPD (chronic obstructive pulmonary disease)   . Hypertension   . Diabetes mellitus   . Renal disorder   . Schmorl's nodes   . Muscle weakness   . Chronic pain   . GERD (gastroesophageal reflux disease)   . Chronic bronchitis   . Hypoxemia   . Depression   . Anemia   . Morbid obesity   . Shortness of breath   . DVT (deep venous thrombosis)    Past Surgical History:  Past Surgical History  Procedure Laterality Date  . Cholecystectomy    . Abdominal hysterectomy     HPI:  80 y.o. year old female w/ multiple medical problems including morbid obesity, bed bound, HTN, DM, CKD, HLD, GERD, lung nodule, ventral hernia admitted with abd pain, ? Incarcerated ventral hernia.  CXR Persistent low volumes with some increase in bibasilar atelectasis or infiltrates left greater than right. Underwent Exploratory Laparotomy, Lysis of Adhesions, End Colostomy and Repair of Incarcerated Hernia Procedure Note 7/15; intubated for surgery only.     Assessment / Plan / Recommendation Clinical Impression  Straws sips provided increased velocity and/or volume with thin leading to cough/throat clears which were decreased with small cup sips.  Instances of prolonged transit/holding with thin.  MD reported pt. will need to initiate clear liquids due to surgery.  Recommend clear liquid diet, NO straws, small sips, pills whole in applesauce and remain upright 45 minutes after meals.  ST will follow due to clinical observations and assist in texture upgrade when appropriate per surgery service.     Aspiration Risk  Moderate    Diet Recommendation Thin liquid (clear liquids)   Liquid Administration via: Cup;No  straw Medication Administration: Whole meds with puree Supervision: Patient able to self feed;Intermittent supervision to cue for compensatory strategies Compensations: Slow rate;Small sips/bites Postural Changes and/or Swallow Maneuvers: Seated upright 90 degrees;Upright 30-60 min after meal    Other  Recommendations Oral Care Recommendations: Oral care BID   Follow Up Recommendations  None    Frequency and Duration min 1 x/week  2 weeks   Pertinent Vitals/Pain WDL         Swallow Study         Oral/Motor/Sensory Function Overall Oral Motor/Sensory Function: Appears within functional limits for tasks assessed   Ice Chips Ice chips: Not tested   Thin Liquid Thin Liquid: Impaired Presentation: Cup;Straw Oral Phase Functional Implications: Prolonged oral transit;Oral holding Pharyngeal  Phase Impairments: Cough - Immediate;Throat Clearing - Delayed    Nectar Thick Nectar Thick Liquid: Not tested   Honey Thick Honey Thick Liquid: Not tested   Puree Puree: Within functional limits   Solid   GO    Solid: Not tested (starting clears per surgery)       Cranford Mon[employer]Ed [employer] [phone]  01/12/2014

## 2014-01-12 NOTE — Progress Notes (Signed)
Patient ID: Brandi [name], female   DOB: Sep 17, 1928, 80 y.o.   MRN: [id number] 2 Days Post-Op  Subjective: Patient her an NG tube out overnight. She is not nauseated this morning. She is passing some stool into her colostomy. Her colostomy is leaking frequently and is having to be changed quite often per the nursing staff.  Objective: Vital signs in last 24 hours: Temp:  [97.4 F (36.3 C)-98.9 F (37.2 C)] 98.8 F (37.1 C) (07/17 0730) Pulse Rate:  [89-96] 89 (07/17 0730) Resp:  [14-27] 16 (07/17 0730) BP: (85-118)/(35-84) 113/84 mmHg (07/17 0730) SpO2:  [92 %-100 %] 100 % (07/17 0730) FiO2 (%):  [40 %] 40 % (07/17 0730) Last BM Date: 01/11/14  Intake/Output from previous day: 07/16 0701 - 07/17 0700 In: 917.5 [I.V.:917.5] Out: 21 [Urine:165; Stool:500] Intake/Output this shift:    PE: Abd: Soft, appropriately tender, few bowel sounds, obese, incision is clean dry and intact with staples however has been contaminated with colostomy leakage a couple times. Colostomy pouch with some air and liquid stool present. The stoma is unable to be visualized due to the amount of stool present.  Lab Results:   Recent Labs  01/10/14 0443 01/11/14 0359  WBC 8.5 20.0*  HGB 10.1* 9.6*  HCT 31.7* 31.0*  PLT 271 229   BMET  Recent Labs  01/10/14 0443 01/11/14 0359  NA 146 152*  K 4.6 4.1  CL 110 116*  CO2 19 19  GLUCOSE 96 121*  BUN 73* 73*  CREATININE 2.44* 2.48*  CALCIUM 8.1* 7.4*   PT/INR No results found for this basename: LABPROT, INR,  in the last 72 hours CMP     Component Value Date/Time   NA 152* 01/11/2014 0359   K 4.1 01/11/2014 0359   CL 116* 01/11/2014 0359   CO2 19 01/11/2014 0359   GLUCOSE 121* 01/11/2014 0359   BUN 73* 01/11/2014 0359   CREATININE 2.48* 01/11/2014 0359   CALCIUM 7.4* 01/11/2014 0359   PROT 5.3* 01/07/2014 0355   ALBUMIN 1.8* 01/07/2014 0355   AST 22 01/07/2014 0355   ALT 10 01/07/2014 0355   ALKPHOS 77 01/07/2014 0355   BILITOT 0.2*  01/07/2014 0355   GFRNONAA 17* 01/11/2014 0359   GFRAA 20* 01/11/2014 0359   Lipase     Component Value Date/Time   LIPASE 34 09/25/2011 1422       Studies/Results: Dg Chest Port 1 View  01/11/2014   CLINICAL DATA:  SOB  EXAM: PORTABLE CHEST - 1 VIEW  COMPARISON:  01/09/2014  FINDINGS: Low lung volumes. Slight increase in the coarse perihilar and bibasilar interstitial and airspace opacities left greater than right. Heart size upper limits normal for technique. Nasogastric tube loops in the stomach. No effusion. Visualized skeletal structures are unremarkable.  IMPRESSION: 1. Persistent low volumes with some increase in bibasilar atelectasis or infiltrates left greater than right   Electronically Signed   By: Arne Cleveland M.D.   On: 01/11/2014 14:23    Anti-infectives: Anti-infectives   Start     Dose/Rate Route Frequency Ordered Stop   01/10/14 1400  ceFAZolin (ANCEF) IVPB 2 g/50 mL premix  Status:  Discontinued     2 g 100 mL/hr over 30 Minutes Intravenous 3 times per day 01/10/14 1223 01/10/14 1243   01/10/14 1400  [MAR Hold]  ceFAZolin (ANCEF) 3 g in dextrose 5 % 50 mL IVPB     (On MAR Hold since 01/10/14 1328)   3 g 160 mL/hr over  30 Minutes Intravenous On call to O.R. 01/10/14 1245 01/10/14 1415       Assessment/Plan  1. POD 2, Exploratory Laparotomy, Lysis of Adhesions, Partial Colectomy, Colostomy and Repair of Incarcerated Hernia (N/A) 2. Questionable dysphasia prior to surgery 3. Obesity  Plan: 1. NG tube has been removed. The patient is not nauseated. I will order a swallow evaluation today prior to placing her on a diet to assure she is not aspirating. If her swallow evaluation is normal we will put her on a liquid diet. 2. [place] consultation today for a new colostomy as well as evaluation for leaking colostomy. We will have to keep a close eye on her midline wound for infection given the amount of stool contamination she has had so far. 3. Would like to mobilize the  patient as possible however she is mostly bed bound. We will have PT evaluate her for some type of activity. 4. Pulmonary toileting  LOS: 8 days    Ahrianna Siglin E 01/12/2014, 7:54 AM Pager: [phone]

## 2014-01-13 DIAGNOSIS — D72829 Elevated white blood cell count, unspecified: Secondary | ICD-10-CM | POA: Diagnosis present

## 2014-01-13 LAB — BASIC METABOLIC PANEL
Anion gap: 16 — ABNORMAL HIGH (ref 5–15)
BUN: 76 mg/dL — AB (ref 6–23)
CALCIUM: 7.2 mg/dL — AB (ref 8.4–10.5)
CO2: 19 meq/L (ref 19–32)
CREATININE: 2.93 mg/dL — AB (ref 0.50–1.10)
Chloride: 108 mEq/L (ref 96–112)
GFR calc Af Amer: 16 mL/min — ABNORMAL LOW (ref >90)
GFR, EST NON AFRICAN AMERICAN: 14 mL/min — AB (ref >90)
GLUCOSE: 193 mg/dL — AB (ref 70–99)
Potassium: 3.4 mEq/L — ABNORMAL LOW (ref 3.7–5.3)
Sodium: 143 mEq/L (ref 137–147)

## 2014-01-13 LAB — CBC
HEMATOCRIT: 24.1 % — AB (ref 36.0–46.0)
HEMOGLOBIN: 7.6 g/dL — AB (ref 12.0–15.0)
MCH: 29.7 pg (ref 26.0–34.0)
MCHC: 31.5 g/dL (ref 30.0–36.0)
MCV: 94.1 fL (ref 78.0–100.0)
Platelets: 185 10*3/uL (ref 150–400)
RBC: 2.56 MIL/uL — AB (ref 3.87–5.11)
RDW: 15.7 % — ABNORMAL HIGH (ref 11.5–15.5)
WBC: 23 10*3/uL — ABNORMAL HIGH (ref 4.0–10.5)

## 2014-01-13 LAB — URINALYSIS, ROUTINE W REFLEX MICROSCOPIC
Bilirubin Urine: NEGATIVE
GLUCOSE, UA: NEGATIVE mg/dL
KETONES UR: NEGATIVE mg/dL
NITRITE: NEGATIVE
PH: 5 (ref 5.0–8.0)
PROTEIN: 100 mg/dL — AB
Specific Gravity, Urine: 1.017 (ref 1.005–1.030)
Urobilinogen, UA: 0.2 mg/dL (ref 0.0–1.0)

## 2014-01-13 LAB — URINE MICROSCOPIC-ADD ON

## 2014-01-13 LAB — GLUCOSE, CAPILLARY
Glucose-Capillary: 143 mg/dL — ABNORMAL HIGH (ref 70–99)
Glucose-Capillary: 151 mg/dL — ABNORMAL HIGH (ref 70–99)
Glucose-Capillary: 188 mg/dL — ABNORMAL HIGH (ref 70–99)
Glucose-Capillary: 143 mg/dL — ABNORMAL HIGH (ref 70–99)

## 2014-01-13 MED ORDER — CARVEDILOL 12.5 MG PO TABS
12.5000 mg | ORAL_TABLET | Freq: Two times a day (BID) | ORAL | Status: DC
  Administered 2014-01-13 – 2014-01-15 (×5): 12.5 mg via ORAL
  Filled 2014-01-13 (×6): qty 1

## 2014-01-13 MED ORDER — LEVOFLOXACIN 500 MG PO TABS
500.0000 mg | ORAL_TABLET | ORAL | Status: DC
  Administered 2014-01-13: 500 mg via ORAL
  Filled 2014-01-13: qty 1

## 2014-01-13 NOTE — Progress Notes (Signed)
Removed IV R chest. Catheter intact. Patient tolerated well.

## 2014-01-13 NOTE — Progress Notes (Signed)
3 Days Post-Op  Subjective: Pt with no further n/v since NGT States she is hungry  Objective: Vital signs in last 24 hours: Temp:  [98.4 F (36.9 C)-100 F (37.8 C)] 99.9 F (37.7 C) (07/18 0726) Pulse Rate:  [85-99] 90 (07/18 0726) Resp:  [12-21] 16 (07/18 0726) BP: (94-117)/(44-64) 107/48 mmHg (07/18 0726) SpO2:  [93 %-100 %] 96 % (07/18 0726) FiO2 (%):  [40 %] 40 % (07/18 0726) Last BM Date: 01/11/14  Intake/Output from previous day: 07/17 0701 - 07/18 0700 In: 3230 [P.O.:2030; I.V.:1200] Out: 1250 [Urine:450; Stool:800] Intake/Output this shift:    General appearance: alert and cooperative GI: s/nt/nd, active BS, ostomy patent, wound c/d/i  Lab Results:   Recent Labs  01/12/14 0810 01/12/14 2350  WBC 23.0* 23.0*  HGB 8.1* 7.6*  HCT 25.6* 24.1*  PLT 197 185   BMET  Recent Labs  01/12/14 0810 01/12/14 2350  NA 149* 143  K 3.8 3.4*  CL 115* 108  CO2 20 19  GLUCOSE 120* 193*  BUN 77* 76*  CREATININE 3.00* 2.93*  CALCIUM 7.2* 7.2*   PT/INR No results found for this basename: LABPROT, INR,  in the last 72 hours ABG  Recent Labs  01/10/14 1641 01/10/14 1845  PHART 7.204* 7.321*  HCO3 23.0 20.8    Studies/Results: Dg Chest Port 1 View  01/11/2014   CLINICAL DATA:  SOB  EXAM: PORTABLE CHEST - 1 VIEW  COMPARISON:  01/09/2014  FINDINGS: Low lung volumes. Slight increase in the coarse perihilar and bibasilar interstitial and airspace opacities left greater than right. Heart size upper limits normal for technique. Nasogastric tube loops in the stomach. No effusion. Visualized skeletal structures are unremarkable.  IMPRESSION: 1. Persistent low volumes with some increase in bibasilar atelectasis or infiltrates left greater than right   Electronically Signed   By: Arne Cleveland M.D.   On: 01/11/2014 14:23    Anti-infectives: Anti-infectives   Start     Dose/Rate Route Frequency Ordered Stop   01/10/14 1400  ceFAZolin (ANCEF) IVPB 2 g/50 mL premix   Status:  Discontinued     2 g 100 mL/hr over 30 Minutes Intravenous 3 times per day 01/10/14 1223 01/10/14 1243   01/10/14 1400  [MAR Hold]  ceFAZolin (ANCEF) 3 g in dextrose 5 % 50 mL IVPB     (On MAR Hold since 01/10/14 1328)   3 g 160 mL/hr over 30 Minutes Intravenous On call to O.R. 01/10/14 1245 01/10/14 1415      Assessment/Plan: s/p Procedure(s): Exploratory Laparotomy, Lysis of Adhesions, Partial Colectomy, Colostomy and Repair of Incarcerated Hernia (N/A) Will adv diet to FLD Mobilize   LOS: 9 days    Rosario Jacks., [facility] 01/13/2014

## 2014-01-13 NOTE — Progress Notes (Addendum)
PATIENT DETAILS Name: Brandi [name] Age: 80 y.o. Sex: female Date of Birth: 10-13-1928 Admit Date: 01/04/2014 Admitting Physician Thurnell Lose, MD [id number], Javier Glazier, MD   Brief narrative:  80 year old morbidly obese female patient w/ a long-standing ventral hernia and recurrent abdominal pain. Patient was initially sent to [facility] from her skilled nursing facility after worsening abdominal pain over 24-48 hours. Imaging done at that time was concerning for possible incarcerated hernia. Because of her high-risk status for future surgical procedures she was refused admission by general surgery at that facility. Her case and clinical findings were discussed with General Surgery at [facility] and the patient was subsequently accepted to this hospital for medical admission with surgical consultation.   After arrival she was also evaluated by General Surgery. It was noted that prior to arrival the patient passed a large BM and expelled significant amount of flatus with improvement in her abdominal exam with decreased tenderness. It was felt that there was no evidence of ischemia and no evidence of obstruction.She was given supporitve care, and eventually transferred out of SDU on 7/13, however on 7/14-she was noted to be Short of breath, had worsening ileus on Xray, NGT was placed, patient is now being transferred back to SDU for closer monitoring.  Surgery  Done 7/15 s/p ex lap with lysis of adhesions and colostomy  Subjective: Thirsty and hungry. Minimal pain. No N/V  Assessment/Plan:  Acute on chronic Resp Failure-hypoxia -wean O2 as tolerated DNR  Added incentive spirometry -Supportive care for now Pt with leukocytosis, low grade fever, cough, postnasal drip and CXR on 7/16 shows low lung volumes, but possible infiltrate v. Atx. Will treat with short course levaquin in case pulmonary infection/sinusitis, and check UA  Hypernatremia Resolved on hypotonic  EVF  Leukocytosis See above.  Incarcerated ventral hernia (transient)/ Adynamic ileus- s/p ex lap with lysis of adhesions and colostomy Advanced to fulls. Transfer to medsurg  Dehydration  -continue IVF  Acute renal failure on Chronic kidney disease, stage III  -Baseline BUN and creatinine dating back to 2013 24/1.73 - avoid hypotension - avoid nephrotoxic medication  -family understands patient a poor candidate for Dialysis. Family agreeable likely will not pursue HD in the future  DIABETES MELLITUS  -CBGs stable-c/w SSI  ANEMIA, IRON DEFICIENCY, CHRONIC  -Baseline hemoglobin around 10.8 as of 2013 - after admission Hgb drifted down to 6.8 - transfused 2 units packed red blood cells - hgb 7.6. May need another unit pRBC if drops further  Hx of HYPERTENSION  ok  Protein calorie malnutrition  Albumin level I.7 with a total protein of 4.8   Morbid obesity - Body mass index is 61.08 kg/(m^2).   AORTIC STENOSIS, MILD   GERD    Code Status: DNR   From SNF Procedures:  None  CONSULTS:  general surgery  MEDICATIONS: Scheduled Meds: . albuterol  2.5 mg Nebulization TID  . antiseptic oral rinse  15 mL Mouth Rinse BID  . aspirin EC  81 mg Oral Daily  . budesonide-formoterol  2 puff Inhalation BID  . calcium carbonate  1 tablet Oral TID WC  . darbepoetin  25 mcg Subcutaneous Q14 Days  . feeding supplement (RESOURCE BREEZE)  1 Container Oral TID BM  . fentaNYL  50 mcg Transdermal Q72H  . fluticasone  2 spray Each Nare Daily  . heparin subcutaneous  5,000 Units Subcutaneous 3 times per day  . insulin aspart  0-15 Units Subcutaneous TID  WC  . loratadine  10 mg Oral Daily  . metoprolol  5 mg Intravenous 3 times per day  . Vitamin D (Ergocalciferol)  50,000 Units Oral Q7 days   Continuous Infusions: . sodium chloride 50 mL/hr at 01/12/14 2000   PRN Meds:.acetaminophen, albuterol, artificial tears, morphine injection, phenol  Antibiotics: Anti-infectives   Start      Dose/Rate Route Frequency Ordered Stop   01/10/14 1400  ceFAZolin (ANCEF) IVPB 2 g/50 mL premix  Status:  Discontinued     2 g 100 mL/hr over 30 Minutes Intravenous 3 times per day 01/10/14 1223 01/10/14 1243   01/10/14 1400  [MAR Hold]  ceFAZolin (ANCEF) 3 g in dextrose 5 % 50 mL IVPB     (On MAR Hold since 01/10/14 1328)   3 g 160 mL/hr over 30 Minutes Intravenous On call to O.R. 01/10/14 1245 01/10/14 1415     Subjective: c/o dry mouth, post nasal drip. No dyspnea, nausea. Pain controlled  PHYSICAL EXAM: Vital signs in last 24 hours: Filed Vitals:   01/13/14 0800 01/13/14 0900 01/13/14 0906 01/13/14 1127  BP: 100/48 106/48  146/80  Pulse: 87 91  92  Temp:    98.7 F (37.1 C)  TempSrc:    Axillary  Resp: 14 14  19   Height:      Weight:      SpO2: 94% 95% 96% 100%    Weight change:  Filed Weights   01/05/14 0316 01/06/14 0530 01/11/14 0427  Weight: 157.398 kg (347 lb) 161.481 kg (356 lb) 161.7 kg (356 lb 7.7 oz)   Body mass index is 61.16 kg/(m^2).   Gen Exam: alert, arousable Neck: Supple, No JVD.   Chest: B/L Clear.  Poor air entry CVS: S1 S2 Regular, + murmurs.  Abdomen: soft, very obese -colosctomy bag with liquid brown. Dressing in place Extremities: 2+ edema, lower extremities warm to touch.   Intake/Output from previous day:  Intake/Output Summary (Last 24 hours) at 01/13/14 1217 Last data filed at 01/13/14 0900  Gross per 24 hour  Intake   2030 ml  Output   1250 ml  Net    780 ml     LAB RESULTS: CBC  Recent Labs Lab 01/08/14 0005 01/10/14 0443 01/11/14 0359 01/12/14 0810 01/12/14 2350  WBC 9.6 8.5 20.0* 23.0* 23.0*  HGB 9.9* 10.1* 9.6* 8.1* 7.6*  HCT 31.4* 31.7* 31.0* 25.6* 24.1*  PLT 256 271 229 197 185  MCV 91.8 91.9 95.7 94.5 94.1  MCH 28.9 29.3 29.6 29.9 29.7  MCHC 31.5 31.9 31.0 31.6 31.5  RDW 15.0 15.1 15.1 15.9* 15.7*    Chemistries   Recent Labs Lab 01/09/14 0105 01/10/14 0443 01/11/14 0359 01/12/14 0810 01/12/14 2350   NA 146 146 152* 149* 143  K 4.8 4.6 4.1 3.8 3.4*  CL 109 110 116* 115* 108  CO2 19 19 19 20 19   GLUCOSE 145* 96 121* 120* 193*  BUN 77* 73* 73* 77* 76*  CREATININE 2.45* 2.44* 2.48* 3.00* 2.93*  CALCIUM 8.4 8.1* 7.4* 7.2* 7.2*    CBG:  Recent Labs Lab 01/12/14 0729 01/12/14 1201 01/12/14 1707 01/13/14 0725 01/13/14 1127  GLUCAP 113* 126* 158* 143* 151*    GFR Estimated Creatinine Clearance: 22.4 ml/min (by C-G formula based on Cr of 2.93).  Coagulation profile No results found for this basename: INR, PROTIME,  in the last 168 hours  Cardiac Enzymes No results found for this basename: CK, CKMB, TROPONINI, MYOGLOBIN,  in the last  168 hours  No components found with this basename: POCBNP,  No results found for this basename: DDIMER,  in the last 72 hours No results found for this basename: HGBA1C,  in the last 72 hours No results found for this basename: CHOL, HDL, LDLCALC, TRIG, CHOLHDL, LDLDIRECT,  in the last 72 hours No results found for this basename: TSH, T4TOTAL, FREET3, T3FREE, THYROIDAB,  in the last 72 hours No results found for this basename: VITAMINB12, FOLATE, FERRITIN, TIBC, IRON, RETICCTPCT,  in the last 72 hours No results found for this basename: LIPASE, AMYLASE,  in the last 72 hours  Urine Studies No results found for this basename: UACOL, UAPR, USPG, UPH, UTP, UGL, UKET, UBIL, UHGB, UNIT, UROB, ULEU, UEPI, UWBC, URBC, UBAC, CAST, CRYS, UCOM, BILUA,  in the last 72 hours  MICROBIOLOGY: Recent Results (from the past 240 hour(s))  MRSA PCR SCREENING     Status: Abnormal   Collection Time    01/04/14  6:30 PM      Result Value Ref Range Status   MRSA by PCR POSITIVE (*) NEGATIVE Final   Comment:            The GeneXpert MRSA Assay (FDA     approved for NASAL specimens     only), is one component of a     comprehensive MRSA colonization     surveillance program. It is not     intended to diagnose MRSA     infection nor to guide or     monitor  treatment for     MRSA infections.     RESULT CALLED TO, READ BACK BY AND VERIFIED WITH:     Sandrea Matte RN 2202 01/05/11 A Clyde Lundborg, MD [facility] Pager:[phone]  If 7PM-7AM, please contact night-coverage [email or web address] Password [email or web address] 01/13/2014, 12:17 PM   LOS: 9 days

## 2014-01-13 NOTE — Progress Notes (Signed)
Preparing Pt. For transfer to [place] and linens changed.

## 2014-01-13 NOTE — Evaluation (Signed)
Physical Therapy Evaluation Patient Details Name: Brandi [name] MRN: [id number] DOB: [birth year] Today's Date: 01/13/2014   History of Present Illness  Patient is an 80 yo female admitted on 01/04/14 - transfer from [facility] - with incarcerated ventral hernia.  Patient s/p expl lap for repair of hernia, lysis of adhesions, colostomy on 01/10/14.   PMH: morbid obesity, bed bound status, HTN, DM, CKD, HLD, ventral hernia, CHF, LE edema, COPD.  Spoke with staff at [facility] - Patient is either in bed or moved to w/c via lift equipment.  Patient does not sit EOB.  Has not stood in over a year.  Clinical Impression   Patient presents with problems listed below.  Spoke with staff at [facility].  Patient is bedbound.  They use lift equipment to move her to w/c.  Has not stood in over a year.  Patient is at her baseline functional status.  Recommend staff use upright position of bed, and use lift for any OOB mobility.  No acute skilled PT needs identified - PT will sign off.    Follow Up Recommendations SNF    Equipment Recommendations  None recommended by PT    Recommendations for Other Services       Precautions / Restrictions Precautions Precautions: Fall Precaution Comments: Morbid obesity - on bari bed Restrictions Weight Bearing Restrictions: No      Mobility  Bed Mobility Overal bed mobility: Needs Assistance;+2 for physical assistance Bed Mobility: Rolling Rolling: Total assist;+2 for physical assistance         General bed mobility comments: Verbal cues for technique.  Patient unable to reach rail to assist with bed mobility.  Requires +2-3 total assist.  Transfers                 General transfer comment: NT  Ambulation/Gait                Stairs            Wheelchair Mobility    Modified Rankin (Stroke Patients Only)       Balance                                             Pertinent Vitals/Pain      Home Living Family/patient expects to be discharged to:: Skilled nursing facility                 Additional Comments: Patient from [facility]    Prior Function Level of Independence: Needs assistance   Gait / Transfers Assistance Needed: Bed bound.  SNF staff move patient bed <> w/c using lift equipment.  Patient unable to sit EOB.  Patient has not stood in over a year.  ADL's / Homemaking Assistance Needed: Total assist with all ADL's        Hand Dominance        Extremity/Trunk Assessment   Upper Extremity Assessment: Generalized weakness           Lower Extremity Assessment: RLE deficits/detail;LLE deficits/detail RLE Deficits / Details: Significant edema noted in LE, especially in foot.  Strength 2/5 - unable to move LE against gravity. LLE Deficits / Details: Significant edema noted in LE, especially in foot.  Strength 2/5 - unable to move LE against gravity.     Communication   Communication: No difficulties  Cognition Arousal/Alertness: Awake/alert  Behavior During Therapy: Flat affect;Anxious Overall Cognitive Status: Impaired/Different from baseline Area of Impairment: Orientation;Attention;Memory;Awareness;Problem solving Orientation Level: Disoriented to;Place;Time;Situation Current Attention Level: Sustained Memory: Decreased short-term memory       Problem Solving: Decreased initiation;Difficulty sequencing General Comments: Patient unable to give accurate information on PLOF.  States it is 64 and May.    General Comments      Exercises        Assessment/Plan    PT Assessment Patent does not need any further PT services  PT Diagnosis     PT Problem List    PT Treatment Interventions     PT Goals (Current goals can be found in the Care Plan section) Acute Rehab PT Goals PT Goal Formulation: No goals set, d/c therapy    Frequency     Barriers to discharge        Co-evaluation               End of Session  Equipment Utilized During Treatment: Oxygen Activity Tolerance: Patient limited by fatigue Patient left: in bed;with call bell/phone within reach Nurse Communication: Mobility status;Need for lift equipment         Time: [phone] PT Time Calculation (min): 16 min   Charges:   PT Evaluation $Initial PT Evaluation Tier I: 1 Procedure PT Treatments $Therapeutic Activity: 8-22 mins   PT G Codes:          Despina Pole 01/13/2014, 5:55 PM Carita Pian. Sanjuana Kava, [place] Pager [phone]

## 2014-01-14 ENCOUNTER — Inpatient Hospital Stay (HOSPITAL_COMMUNITY): Payer: PRIVATE HEALTH INSURANCE

## 2014-01-14 DIAGNOSIS — N390 Urinary tract infection, site not specified: Secondary | ICD-10-CM | POA: Clinically undetermined

## 2014-01-14 DIAGNOSIS — J9600 Acute respiratory failure, unspecified whether with hypoxia or hypercapnia: Secondary | ICD-10-CM

## 2014-01-14 DIAGNOSIS — R0609 Other forms of dyspnea: Secondary | ICD-10-CM

## 2014-01-14 DIAGNOSIS — R0600 Dyspnea, unspecified: Secondary | ICD-10-CM | POA: Diagnosis present

## 2014-01-14 DIAGNOSIS — R0989 Other specified symptoms and signs involving the circulatory and respiratory systems: Secondary | ICD-10-CM

## 2014-01-14 DIAGNOSIS — T83511A Infection and inflammatory reaction due to indwelling urethral catheter, initial encounter: Secondary | ICD-10-CM

## 2014-01-14 LAB — BASIC METABOLIC PANEL
Anion gap: 16 — ABNORMAL HIGH (ref 5–15)
BUN: 70 mg/dL — ABNORMAL HIGH (ref 6–23)
CO2: 20 mEq/L (ref 19–32)
CREATININE: 2.88 mg/dL — AB (ref 0.50–1.10)
Calcium: 7 mg/dL — ABNORMAL LOW (ref 8.4–10.5)
Chloride: 107 mEq/L (ref 96–112)
GFR calc Af Amer: 16 mL/min — ABNORMAL LOW (ref >90)
GFR calc non Af Amer: 14 mL/min — ABNORMAL LOW (ref >90)
GLUCOSE: 166 mg/dL — AB (ref 70–99)
POTASSIUM: 3.6 meq/L — AB (ref 3.7–5.3)
Sodium: 143 mEq/L (ref 137–147)

## 2014-01-14 LAB — GLUCOSE, CAPILLARY
GLUCOSE-CAPILLARY: 125 mg/dL — AB (ref 70–99)
Glucose-Capillary: 140 mg/dL — ABNORMAL HIGH (ref 70–99)
Glucose-Capillary: 156 mg/dL — ABNORMAL HIGH (ref 70–99)
Glucose-Capillary: 141 mg/dL — ABNORMAL HIGH (ref 70–99)

## 2014-01-14 LAB — PRO B NATRIURETIC PEPTIDE: Pro B Natriuretic peptide (BNP): 9139 pg/mL — ABNORMAL HIGH (ref 0–450)

## 2014-01-14 LAB — CBC
HCT: 25.3 % — ABNORMAL LOW (ref 36.0–46.0)
HEMOGLOBIN: 8 g/dL — AB (ref 12.0–15.0)
MCH: 29.5 pg (ref 26.0–34.0)
MCHC: 31.6 g/dL (ref 30.0–36.0)
MCV: 93.4 fL (ref 78.0–100.0)
Platelets: 175 10*3/uL (ref 150–400)
RBC: 2.71 MIL/uL — ABNORMAL LOW (ref 3.87–5.11)
RDW: 15.6 % — ABNORMAL HIGH (ref 11.5–15.5)
WBC: 21.6 10*3/uL — AB (ref 4.0–10.5)

## 2014-01-14 MED ORDER — FUROSEMIDE 10 MG/ML IJ SOLN
40.0000 mg | Freq: Once | INTRAMUSCULAR | Status: AC
  Administered 2014-01-14: 40 mg via INTRAVENOUS
  Filled 2014-01-14: qty 4

## 2014-01-14 MED ORDER — FUROSEMIDE 40 MG PO TABS
40.0000 mg | ORAL_TABLET | Freq: Two times a day (BID) | ORAL | Status: DC
  Administered 2014-01-14 – 2014-01-15 (×3): 40 mg via ORAL
  Filled 2014-01-14 (×4): qty 1

## 2014-01-14 MED ORDER — DEXTROSE 5 % IV SOLN
1.0000 g | INTRAVENOUS | Status: DC
  Administered 2014-01-14 – 2014-01-15 (×2): 1 g via INTRAVENOUS
  Filled 2014-01-14 (×2): qty 10

## 2014-01-14 NOTE — Progress Notes (Signed)
4 Days Post-Op  Subjective: Pt getting breathing txt. Food tray for most part empty. Denies n/v/burping.   Objective: Vital signs in last 24 hours: Temp:  [98.3 F (36.8 C)-98.7 F (37.1 C)] 98.3 F (36.8 C) (07/19 0508) Pulse Rate:  [87-99] 87 (07/19 0508) Resp:  [11-39] 18 (07/19 0508) BP: (95-146)/(39-81) 128/72 mmHg (07/19 0508) SpO2:  [93 %-100 %] 95 % (07/19 0508) FiO2 (%):  [40 %] 40 % (07/18 1127) Last BM Date: 01/11/14  Intake/Output from previous day: 07/18 0701 - 07/19 0700 In: 725 [I.V.:725] Out: 66 [Urine:700; Stool:375] Intake/Output this shift:    Extreme obesity, nad Obese, soft, incision - no cellulitis. Ostomy wafer just next to midline with some drainage on midline incision - doesn't appear to be from midline. Reinforced ostomy bandage. Stool in bag  Lab Results:   Recent Labs  01/12/14 0810 01/12/14 2350  WBC 23.0* 23.0*  HGB 8.1* 7.6*  HCT 25.6* 24.1*  PLT 197 185   BMET  Recent Labs  01/12/14 0810 01/12/14 2350  NA 149* 143  K 3.8 3.4*  CL 115* 108  CO2 20 19  GLUCOSE 120* 193*  BUN 77* 76*  CREATININE 3.00* 2.93*  CALCIUM 7.2* 7.2*   PT/INR No results found for this basename: LABPROT, INR,  in the last 72 hours ABG No results found for this basename: PHART, PCO2, PO2, HCO3,  in the last 72 hours  Studies/Results: No results found.  Anti-infectives: Anti-infectives   Start     Dose/Rate Route Frequency Ordered Stop   01/13/14 1400  levofloxacin (LEVAQUIN) tablet 500 mg     500 mg Oral Every 48 hours 01/13/14 1225     01/10/14 1400  ceFAZolin (ANCEF) IVPB 2 g/50 mL premix  Status:  Discontinued     2 g 100 mL/hr over 30 Minutes Intravenous 3 times per day 01/10/14 1223 01/10/14 1243   01/10/14 1400  [MAR Hold]  ceFAZolin (ANCEF) 3 g in dextrose 5 % 50 mL IVPB     (On MAR Hold since 01/10/14 1328)   3 g 160 mL/hr over 30 Minutes Intravenous On call to O.R. 01/10/14 1245 01/10/14 1415      Assessment/Plan: s/p  Procedure(s): Exploratory Laparotomy, Lysis of Adhesions, Partial Colectomy, Colostomy and Repair of Incarcerated Hernia (N/A) Active Problems:   DIABETES MELLITUS   Morbid obesity   ANEMIA, IRON DEFICIENCY, CHRONIC   HYPERTENSION   AORTIC STENOSIS, MILD   GERD   Incarcerated ventral hernia (transient)   Dehydration   Acute renal failure   CKD (chronic kidney disease), stage III   Acute respiratory failure with hypoxia   Adynamic ileus   Leukocytosis, unspecified   Will advance to diabetic Cont therapies Will need to monitor midline incision  Brandi [name]. Brandi Pulling, MD, FACS General, Bariatric, & Minimally Invasive Surgery [facility], [place]   LOS: 10 days    Brandi [name] 01/14/2014

## 2014-01-14 NOTE — Progress Notes (Signed)
PATIENT DETAILS Name: Brandi [name] BOB EASTWOOD Age: 80 y.o. Sex: female Date of Birth: Jun 20, 1929 Admit Date: 01/04/2014 Admitting Physician Thurnell Lose, MD [id number], Javier Glazier, MD   Brief narrative:  80 year old morbidly obese female patient w/ a long-standing ventral hernia and recurrent abdominal pain. Patient was initially sent to [facility] from her skilled nursing facility after worsening abdominal pain over 24-48 hours. Imaging done at that time was concerning for possible incarcerated hernia. Because of her high-risk status for future surgical procedures she was refused admission by general surgery at that facility. Her case and clinical findings were discussed with General Surgery at [facility] and the patient was subsequently accepted to this hospital for medical admission with surgical consultation.   After arrival she was also evaluated by General Surgery. It was noted that prior to arrival the patient passed a large BM and expelled significant amount of flatus with improvement in her abdominal exam with decreased tenderness. It was felt that there was no evidence of ischemia and no evidence of obstruction.She was given supporitve care, and eventually transferred out of SDU on 7/13, however on 7/14-she was noted to be Short of breath, had worsening ileus on Xray, NGT was placed, patient is now being transferred back to SDU for closer monitoring.  Surgery  Done 7/15 s/p ex lap with lysis of adhesions and colostomy  Assessment/Plan:  Acute on chronic Resp Failure-hypoxia Complaining of dyspnea today. On lasix 40 bid at SNF. Will d/c IVF, give lasix IV now, check CXR.  Hypernatremia Resolved on hypotonic IVF  Leukocytosis Has uti. Will change abx to rocephin and check urine culture  Incarcerated ventral hernia (transient)/ Adynamic ileus- s/p ex lap with lysis of adhesions and colostomy Advanced to solids  Dehydration  See above  Acute renal failure on Chronic  kidney disease, stage III  -Baseline BUN and creatinine dating back to 2013 24/1.73 - avoid hypotension - avoid nephrotoxic medication  -family understands patient a poor candidate for Dialysis. Family agreeable likely will not pursue HD in the future  DIABETES MELLITUS  controlled  ANEMIA, IRON DEFICIENCY, CHRONIC  -Baseline hemoglobin around 10.8 as of 2013 - after admission Hgb drifted down to 6.8 - transfused 2 units packed red blood cells. CBC pending  Hx of HYPERTENSION  ok  Protein calorie malnutrition  Albumin level I.7 with a total protein of 4.8   Morbid obesity - Body mass index is 61.08 kg/(m^2).   AORTIC STENOSIS, MILD   GERD    Code Status: DNR   From SNF Procedures:  None  CONSULTS:  general surgery  MEDICATIONS: Scheduled Meds: . albuterol  2.5 mg Nebulization TID  . antiseptic oral rinse  15 mL Mouth Rinse BID  . aspirin EC  81 mg Oral Daily  . budesonide-formoterol  2 puff Inhalation BID  . calcium carbonate  1 tablet Oral TID WC  . carvedilol  12.5 mg Oral BID WC  . cefTRIAXone (ROCEPHIN)  IV  1 g Intravenous Q24H  . darbepoetin  25 mcg Subcutaneous Q14 Days  . feeding supplement (RESOURCE BREEZE)  1 Container Oral TID BM  . fentaNYL  50 mcg Transdermal Q72H  . fluticasone  2 spray Each Nare Daily  . furosemide  40 mg Intravenous Once  . furosemide  40 mg Oral BID  . heparin subcutaneous  5,000 Units Subcutaneous 3 times per day  . insulin aspart  0-15 Units Subcutaneous TID WC  . Vitamin D (Ergocalciferol)  50,000 Units Oral Q7 days   Continuous Infusions:   PRN Meds:.acetaminophen, albuterol, artificial tears, morphine injection, phenol  Antibiotics: Anti-infectives   Start     Dose/Rate Route Frequency Ordered Stop   01/14/14 1100  cefTRIAXone (ROCEPHIN) 1 g in dextrose 5 % 50 mL IVPB     1 g 100 mL/hr over 30 Minutes Intravenous Every 24 hours 01/14/14 1013     01/13/14 1400  levofloxacin (LEVAQUIN) tablet 500 mg  Status:   Discontinued     500 mg Oral Every 48 hours 01/13/14 1225 01/14/14 1013   01/10/14 1400  ceFAZolin (ANCEF) IVPB 2 g/50 mL premix  Status:  Discontinued     2 g 100 mL/hr over 30 Minutes Intravenous 3 times per day 01/10/14 1223 01/10/14 1243   01/10/14 1400  [MAR Hold]  ceFAZolin (ANCEF) 3 g in dextrose 5 % 50 mL IVPB     (On MAR Hold since 01/10/14 1328)   3 g 160 mL/hr over 30 Minutes Intravenous On call to O.R. 01/10/14 1245 01/10/14 1415     Subjective: C/o dyspnea, thirsty  PHYSICAL EXAM: Vital signs in last 24 hours: Filed Vitals:   01/13/14 1900 01/13/14 2206 01/14/14 0508 01/14/14 1003  BP:  130/81 128/72 146/59  Pulse:  96 87 97  Temp:  98.4 F (36.9 C) 98.3 F (36.8 C) 98.5 F (36.9 C)  TempSrc:  Oral Oral Oral  Resp:  20 18 20   Height:      Weight:      SpO2: 94% 95% 95% 100%    Weight change:  Filed Weights   01/05/14 0316 01/06/14 0530 01/11/14 0427  Weight: 157.398 kg (347 lb) 161.481 kg (356 lb) 161.7 kg (356 lb 7.7 oz)   Body mass index is 61.16 kg/(m^2).   Gen Exam: alert, frowning Neck: Supple, No JVD.   Chest: diminished no WRR CVS: rRR without MGR Abdomen: soft, very obese -colosctomy bag with liquid brown. Dressing in place Extremities: 2+ edema, lower extremities warm to touch.   Intake/Output from previous day:  Intake/Output Summary (Last 24 hours) at 01/14/14 1028 Last data filed at 01/14/14 0509  Gross per 24 hour  Intake    625 ml  Output   1075 ml  Net   -450 ml     LAB RESULTS: CBC  Recent Labs Lab 01/08/14 0005 01/10/14 0443 01/11/14 0359 01/12/14 0810 01/12/14 2350  WBC 9.6 8.5 20.0* 23.0* 23.0*  HGB 9.9* 10.1* 9.6* 8.1* 7.6*  HCT 31.4* 31.7* 31.0* 25.6* 24.1*  PLT 256 271 229 197 185  MCV 91.8 91.9 95.7 94.5 94.1  MCH 28.9 29.3 29.6 29.9 29.7  MCHC 31.5 31.9 31.0 31.6 31.5  RDW 15.0 15.1 15.1 15.9* 15.7*    Chemistries   Recent Labs Lab 01/09/14 0105 01/10/14 0443 01/11/14 0359 01/12/14 0810  01/12/14 2350  NA 146 146 152* 149* 143  K 4.8 4.6 4.1 3.8 3.4*  CL 109 110 116* 115* 108  CO2 19 19 19 20 19   GLUCOSE 145* 96 121* 120* 193*  BUN 77* 73* 73* 77* 76*  CREATININE 2.45* 2.44* 2.48* 3.00* 2.93*  CALCIUM 8.4 8.1* 7.4* 7.2* 7.2*    CBG:  Recent Labs Lab 01/13/14 0725 01/13/14 1127 01/13/14 1558 01/13/14 2159 01/14/14 0738  GLUCAP 143* 151* 188* 143* 125*    GFR Estimated Creatinine Clearance: 22.4 ml/min (by C-G formula based on Cr of 2.93).  Coagulation profile No results found for this basename: INR, PROTIME,  in  the last 168 hours  Cardiac Enzymes No results found for this basename: CK, CKMB, TROPONINI, MYOGLOBIN,  in the last 168 hours  No components found with this basename: POCBNP,  No results found for this basename: DDIMER,  in the last 72 hours No results found for this basename: HGBA1C,  in the last 72 hours No results found for this basename: CHOL, HDL, LDLCALC, TRIG, CHOLHDL, LDLDIRECT,  in the last 72 hours No results found for this basename: TSH, T4TOTAL, FREET3, T3FREE, THYROIDAB,  in the last 72 hours No results found for this basename: VITAMINB12, FOLATE, FERRITIN, TIBC, IRON, RETICCTPCT,  in the last 72 hours No results found for this basename: LIPASE, AMYLASE,  in the last 72 hours  Urine Studies No results found for this basename: UACOL, UAPR, USPG, UPH, UTP, UGL, UKET, UBIL, UHGB, UNIT, UROB, ULEU, UEPI, UWBC, URBC, UBAC, CAST, CRYS, UCOM, BILUA,  in the last 72 hours  MICROBIOLOGY: Recent Results (from the past 240 hour(s))  MRSA PCR SCREENING     Status: Abnormal   Collection Time    01/04/14  6:30 PM      Result Value Ref Range Status   MRSA by PCR POSITIVE (*) NEGATIVE Final   Comment:            The GeneXpert MRSA Assay (FDA     approved for NASAL specimens     only), is one component of a     comprehensive MRSA colonization     surveillance program. It is not     intended to diagnose MRSA     infection nor to guide or      monitor treatment for     MRSA infections.     RESULT CALLED TO, READ BACK BY AND VERIFIED WITH:     Sandrea Matte RN 2202 01/05/11 A Clyde Lundborg, MD [facility] Pager:[phone]  If 7PM-7AM, please contact night-coverage [email or web address] Password [email or web address] 01/14/2014, 10:28 AM   LOS: 10 days

## 2014-01-15 LAB — GLUCOSE, CAPILLARY
GLUCOSE-CAPILLARY: 130 mg/dL — AB (ref 70–99)
Glucose-Capillary: 137 mg/dL — ABNORMAL HIGH (ref 70–99)
Glucose-Capillary: 142 mg/dL — ABNORMAL HIGH (ref 70–99)

## 2014-01-15 MED ORDER — CEFUROXIME AXETIL 500 MG PO TABS: 500.0000 mg | ORAL_TABLET | Freq: Two times a day (BID) | ORAL | Status: DC

## 2014-01-15 MED ORDER — FENTANYL 50 MCG/HR TD PT72: 50.0000 ug | MEDICATED_PATCH | TRANSDERMAL | Status: DC

## 2014-01-15 MED ORDER — HYDROCODONE-ACETAMINOPHEN 10-325 MG PO TABS: 1.0000 | ORAL_TABLET | Freq: Four times a day (QID) | ORAL | Status: AC | PRN

## 2014-01-15 MED ORDER — BOOST / RESOURCE BREEZE PO LIQD: 1.0000 | Freq: Three times a day (TID) | ORAL | Status: AC

## 2014-01-15 MED ORDER — ASPIRIN 81 MG PO TBEC: 81.0000 mg | DELAYED_RELEASE_TABLET | Freq: Every day | ORAL | Status: AC

## 2014-01-15 MED ORDER — HYDROCODONE-ACETAMINOPHEN 10-325 MG PO TABS: 1.0000 | ORAL_TABLET | Freq: Four times a day (QID) | ORAL | Status: DC | PRN

## 2014-01-15 NOTE — Progress Notes (Signed)
Patient to be discharged back to [facility]. PIV and foley catheter removed per orders. Dressing changed to midline abdominal incision. All belongings placed in belonging bags. Report called to Manuela Schwartz.   Joellen Jersey, RN.

## 2014-01-15 NOTE — Progress Notes (Signed)
Speech Language Pathology Treatment: Dysphagia  Patient Details Name: Brandi [name] MRN: [id number] DOB: [birth year] Today's Date: 01/15/2014 Time: 1245-1300 SLP Time Calculation (min): 15 min  Assessment / Plan / Recommendation Clinical Impression  Pt was eating lunch when SLP entered the room.  Pt was observed to have a regular diet tray, and was having considerable difficulty masticating solids, as she is edentulous. Pt was noted to chew foods then expectorate them prior to swallowing.  No overt s/s aspiration or clinical indication of airway compromise observed or reported. Recommend changing diet to fine chopped solids and thin liquids, due to pt having no teeth with which to chew solids.  Pt scheduled for DC today, but SLP will continue to follow and monitor diet tolerance if acute stay continues.   HPI HPI: 80 y.o. year old female w/ multiple medical problems including morbid obesity, bed bound, HTN, DM, CKD, HLD, GERD, lung nodule, ventral hernia admitted with abd pain, ? Incarcerated ventral hernia.  CXR Persistent low volumes with some increase in bibasilar atelectasis or infiltrates left greater than right. Underwent Exploratory Laparotomy, Lysis of Adhesions, End Colostomy and Repair of Incarcerated Hernia Procedure Note 7/15; intubated for surgery only.  BSE completed 01/12/14. ST in to assess diet tolerance   Pertinent Vitals VSS  SLP Plan  Continue with current plan of care    Recommendations Diet recommendations: Dysphagia 2 (fine chop);Thin liquid Liquids provided via: Cup;Straw Medication Administration: Whole meds with puree Supervision: Patient able to self feed;Intermittent supervision to cue for compensatory strategies Compensations: Slow rate;Small sips/bites Postural Changes and/or Swallow Maneuvers: Seated upright 90 degrees;Upright 30-60 min after meal              Oral Care Recommendations: Oral care BID Follow up Recommendations: None Plan: Continue with  current plan of care    [place] B. Brandi [name] [facility], CCC-SLP [phone]  [name] 01/15/2014, 1:32 PM

## 2014-01-15 NOTE — Discharge Instructions (Signed)
ABDOMINAL SURGERY: POST OP INSTRUCTIONS ° °1. DIET: Follow a light bland diet the first 24 hours after arrival home, such as soup, liquids, crackers, etc.  Be sure to include lots of fluids daily.  Avoid fast food or heavy meals as your are more likely to get nauseated.  Eat a low fat the next few days after surgery.   °2. Take your usually prescribed home medications unless otherwise directed. °3. PAIN CONTROL: °a. Pain is best controlled by a usual combination of three different methods TOGETHER: °i. Ice/Heat °ii. Over the counter pain medication °iii. Prescription pain medication °b. Most patients will experience some swelling and bruising around the incisions.  Ice packs or heating pads (30-60 minutes up to 6 times a day) will help. Use ice for the first few days to help decrease swelling and bruising, then switch to heat to help relax tight/sore spots and speed recovery.  Some people prefer to use ice alone, heat alone, alternating between ice & heat.  Experiment to what works for you.  Swelling and bruising can take several weeks to resolve.   °c. It is helpful to take an over-the-counter pain medication regularly for the first few weeks.  Choose one of the following that works best for you: °i. Naproxen (Aleve, etc)  Two 220mg tabs twice a day °ii. Ibuprofen (Advil, etc) Three 200mg tabs four times a day (every meal & bedtime) °iii. Acetaminophen (Tylenol, etc) 500-650mg four times a day (every meal & bedtime) °d. A  prescription for pain medication (such as oxycodone, hydrocodone, etc) should be given to you upon discharge.  Take your pain medication as prescribed.  °i. If you are having problems/concerns with the prescription medicine (does not control pain, nausea, vomiting, rash, itching, etc), please call us [phone] to see if we need to switch you to a different pain medicine that will work better for you and/or control your side effect better. °ii. If you need a refill on your pain medication,  please contact your pharmacy.  They will contact our office to request authorization. Prescriptions will not be filled after 5 pm or on week-ends. °4. Avoid getting constipated.  Between the surgery and the pain medications, it is common to experience some constipation.  Increasing fluid intake and taking a fiber supplement (such as Metamucil, Citrucel, FiberCon, MiraLax, etc) 1-2 times a day regularly will usually help prevent this problem from occurring.  A mild laxative (prune juice, Milk of Magnesia, MiraLax, etc) should be taken according to package directions if there are no bowel movements after 48 hours.   °5. Watch out for diarrhea.  If you have many loose bowel movements, simplify your diet to bland foods & liquids for a few days.  Stop any stool softeners and decrease your fiber supplement.  Switching to mild anti-diarrheal medications (Kayopectate, Pepto Bismol) can help.  If this worsens or does not improve, please call us. °6. Wash / shower every day.  You may shower over the incision / wound.  Avoid baths until the skin is fully healed.  Continue to shower over incision(s) after the dressing is off. °7. Remove your waterproof bandages 5 days after surgery.  You may leave the incision open to air.  You may replace a dressing/Band-Aid to cover the incision for comfort if you wish. °8. ACTIVITIES as tolerated:   °a. You may resume regular (light) daily activities beginning the next day--such as daily self-care, walking, climbing stairs--gradually increasing activities as tolerated.  If you can   walk 30 minutes without difficulty, it is safe to try more intense activity such as jogging, treadmill, bicycling, low-impact aerobics, swimming, etc. b. Save the most intensive and strenuous activity for last such as sit-ups, heavy lifting, contact sports, etc  Refrain from any heavy lifting or straining until you are off narcotics for pain control.   c. DO NOT PUSH THROUGH PAIN.  Let pain be your guide: If it  hurts to do something, don't do it.  Pain is your body warning you to avoid that activity for another week until the pain goes down. d. You may drive when you are no longer taking prescription pain medication, you can comfortably wear a seatbelt, and you can safely maneuver your car and apply brakes. e. Dennis Bast may have sexual intercourse when it is comfortable.  9. FOLLOW UP in our office a. Please call [facility] at [phone] to set up an appointment to see your surgeon in the office for a follow-up appointment approximately 1-2 weeks after your surgery. b. Make sure that you call for this appointment the day you arrive home to insure a convenient appointment time. 10. IF YOU HAVE DISABILITY OR FAMILY LEAVE FORMS, BRING THEM TO THE OFFICE FOR PROCESSING.  DO NOT GIVE THEM TO YOUR DOCTOR.   WHEN TO CALL us [phone]: 1. Poor pain control 2. Reactions / problems with new medications (rash/itching, nausea, etc)  3. Fever over 101.5 F (38.5 C) 4. Inability to urinate 5. Nausea and/or vomiting 6. Worsening swelling or bruising 7. Continued bleeding from incision. 8. Increased pain, redness, or drainage from the incision  The clinic staff is available to answer your questions during regular business hours (8:30am-5pm).  Please dont hesitate to call and ask to speak to one of our nurses for clinical concerns.   A surgeon from [facility] is always on call at the hospitals   If you have a medical emergency, go to the nearest emergency room or call 911.    [facility], [place], [place], [place], [place]  [address] ? MAIN: [phone] ? TOLL FREE: [phone] ? FAX [phone] [email or web address]  WOUND CARE  It is important that the wound be kept open.   -Keeping the skin edges apart will allow the wound to gradually heal from the base upwards.   - If the skin edges of the wound close too early, a new fluid pocket can form and  infection can occur. -This is the reason to pack deeper wounds with gauze or ribbon -This is why drained wounds cannot be sewed closed right away  A healthy wound should form a lining of bright red "beefy" granulating tissue that will help shrink the wound and help the edges grow new skin into it.   -A little mucus / yellow discharge is normal (the body's natural way to try and form a scab) and should be gently washed off with soap and water with daily dressing changes.  -Green or foul smelling drainage implies bacterial colonization and can slow wound healing - a short course of antibiotic ointment (3-5 days) can help it clear up.  Call the doctor if it does not improve or worsens  -Avoid use of antibiotic ointments for more than a week as they can slow wound healing over time.    -Sometimes other wound care products will be used to reduce need for dressing changes and/or help clean up dirty wounds -Sometimes the surgeon needs to debride the wound  in the office to remove dead or infected tissue out of the wound so it can heal more quickly and safely.    Change the dressing at least once a day -Wash the wound with mild soap and water gently every day.  It is good to shower or bathe the wound to help it clean out. -Use clean 4x4 gauze for medium/large wounds or ribbon plain NU-gauze for smaller wounds (it does not need to be sterile, just clean) -Keep the raw wound moist with a little saline or KY (saline) gel on the gauze.  -A dry wound will take longer to heal.  -Keep the skin dry around the wound to prevent breakdown and irritation. -Pack the wound down to the base -The goal is to keep the skin apart, not overpack the wound -Use a Q-tip or blunt-tipped kabob stick toothpick to push the gauze down to the base in narrow or deep wounds   -Cover with a clean gauze and tape -paper or Medipore tape tend to be gentle on the skin -rotate the orientation of the tape to avoid repeated stress/trauma on  the skin -using an ACE or Coban wrap on wounds on arms or legs can be used instead.  Complete all antibiotics through the entire prescription to help the infection heal and prevent new places of infection   Returning the see the surgeon is helpful to follow the healing process and help the wound close as fast as possible.

## 2014-01-15 NOTE — Progress Notes (Signed)
Patient ID: Brandi [name], female   DOB: [birth year], 80 y.o.   MRN: [id number]   Subjective: Appetite is fair.  Tolerating POs.  Good adequate ostomy output.    Objective:  Vital signs:  Filed Vitals:   01/14/14 2045 01/14/14 2213 01/15/14 0539 01/15/14 0905  BP:  131/80 116/72   Pulse:  85 87   Temp:  98.2 F (36.8 C) 98.6 F (37 C)   TempSrc:  Oral Oral   Resp:  19 20   Height:      Weight:      SpO2: 95% 97% 96% 96%    Last BM Date: 01/14/14  Intake/Output   Yesterday:  07/19 0701 - 07/20 0700 In: 1060 [P.O.:860; I.V.:150; IV Piggyback:50] Out: 2400 [Urine:1850; Stool:550] This shift:    I/O last 3 completed shifts: In: 0160 [P.O.:860; I.V.:525; IV Piggyback:50] Out: 3125 [Urine:2400; Stool:725]    Physical Exam: General: Pt awake/alert/oriented x4 in  No acute distress Abdomen: Soft.  Nondistended.  Appropriately tender.  Midline wound, base opened(3 staples, hematoma, no signs of infection) No evidence of peritonitis.  No incarcerated hernias.   Problem List:   Active Problems:   DIABETES MELLITUS   Morbid obesity   ANEMIA, IRON DEFICIENCY, CHRONIC   HYPERTENSION   AORTIC STENOSIS, MILD   GERD   Incarcerated ventral hernia (transient)   Dehydration   Acute renal failure   CKD (chronic kidney disease), stage III   Acute respiratory failure with hypoxia   Adynamic ileus   Leukocytosis, unspecified   Dyspnea   UTI (urinary tract infection)    Results:   Labs: Results for orders placed during the hospital encounter of 01/04/14 (from the past 48 hour(s))  GLUCOSE, CAPILLARY     Status: Abnormal   Collection Time    01/13/14 11:27 AM      Result Value Ref Range   Glucose-Capillary 151 (*) 70 - 99 mg/dL  URINALYSIS, ROUTINE W REFLEX MICROSCOPIC     Status: Abnormal   Collection Time    01/13/14  1:36 PM      Result Value Ref Range   Color, Urine YELLOW  YELLOW   APPearance TURBID (*) CLEAR   Specific Gravity, Urine 1.017  1.005 - 1.030    pH 5.0  5.0 - 8.0   Glucose, UA NEGATIVE  NEGATIVE mg/dL   Hgb urine dipstick LARGE (*) NEGATIVE   Bilirubin Urine NEGATIVE  NEGATIVE   Ketones, ur NEGATIVE  NEGATIVE mg/dL   Protein, ur 100 (*) NEGATIVE mg/dL   Urobilinogen, UA 0.2  0.0 - 1.0 mg/dL   Nitrite NEGATIVE  NEGATIVE   Leukocytes, UA LARGE (*) NEGATIVE  URINE MICROSCOPIC-ADD ON     Status: None   Collection Time    01/13/14  1:36 PM      Result Value Ref Range   Squamous Epithelial / LPF RARE  RARE   WBC, UA TOO NUMEROUS TO COUNT  <3 WBC/hpf   RBC / HPF 3-6  <3 RBC/hpf   Urine-Other FEW YEAST    GLUCOSE, CAPILLARY     Status: Abnormal   Collection Time    01/13/14  3:58 PM      Result Value Ref Range   Glucose-Capillary 188 (*) 70 - 99 mg/dL  GLUCOSE, CAPILLARY     Status: Abnormal   Collection Time    01/13/14  9:59 PM      Result Value Ref Range   Glucose-Capillary 143 (*) 70 - 99 mg/dL  GLUCOSE,  CAPILLARY     Status: Abnormal   Collection Time    01/14/14  7:38 AM      Result Value Ref Range   Glucose-Capillary 125 (*) 70 - 99 mg/dL  CBC     Status: Abnormal   Collection Time    01/14/14 10:42 AM      Result Value Ref Range   WBC 21.6 (*) 4.0 - 10.5 K/uL   RBC 2.71 (*) 3.87 - 5.11 MIL/uL   Hemoglobin 8.0 (*) 12.0 - 15.0 g/dL   HCT 35.7 (*) 01.7 - 79.3 %   MCV 93.4  78.0 - 100.0 fL   MCH 29.5  26.0 - 34.0 pg   MCHC 31.6  30.0 - 36.0 g/dL   RDW 90.3 (*) 00.9 - 23.3 %   Platelets 175  150 - 400 K/uL  BASIC METABOLIC PANEL     Status: Abnormal   Collection Time    01/14/14 10:42 AM      Result Value Ref Range   Sodium 143  137 - 147 mEq/L   Potassium 3.6 (*) 3.7 - 5.3 mEq/L   Chloride 107  96 - 112 mEq/L   CO2 20  19 - 32 mEq/L   Glucose, Bld 166 (*) 70 - 99 mg/dL   BUN 70 (*) 6 - 23 mg/dL   Creatinine, Ser 0.07 (*) 0.50 - 1.10 mg/dL   Calcium 7.0 (*) 8.4 - 10.5 mg/dL   GFR calc non Af Amer 14 (*) >90 mL/min   GFR calc Af Amer 16 (*) >90 mL/min   Comment: (NOTE)     The eGFR has been calculated  using the CKD EPI equation.     This calculation has not been validated in all clinical situations.     eGFR's persistently <90 mL/min signify possible Chronic Kidney     Disease.   Anion gap 16 (*) 5 - 15  PRO B NATRIURETIC PEPTIDE     Status: Abnormal   Collection Time    01/14/14 10:42 AM      Result Value Ref Range   Pro B Natriuretic peptide (BNP) 9139.0 (*) 0 - 450 pg/mL  GLUCOSE, CAPILLARY     Status: Abnormal   Collection Time    01/14/14 12:00 PM      Result Value Ref Range   Glucose-Capillary 140 (*) 70 - 99 mg/dL  GLUCOSE, CAPILLARY     Status: Abnormal   Collection Time    01/14/14  4:34 PM      Result Value Ref Range   Glucose-Capillary 156 (*) 70 - 99 mg/dL  GLUCOSE, CAPILLARY     Status: Abnormal   Collection Time    01/14/14 10:11 PM      Result Value Ref Range   Glucose-Capillary 141 (*) 70 - 99 mg/dL   Comment 1 Documented in Chart     Comment 2 Notify RN    GLUCOSE, CAPILLARY     Status: Abnormal   Collection Time    01/15/14  7:48 AM      Result Value Ref Range   Glucose-Capillary 137 (*) 70 - 99 mg/dL    Imaging / Studies: Dg Chest Port 1 View  01/14/2014   CLINICAL DATA:  Dyspnea.  COPD.  Congestive heart failure.  EXAM: PORTABLE CHEST - 1 VIEW  COMPARISON:  01/11/2014  FINDINGS: Low lung volumes again demonstrated. Cardiomegaly is stable. Diffuse interstitial infiltrates again seen, suspicious for mild interstitial edema. Mild atelectasis or infiltrate in the left  retrocardiac lung base also shows no significant change. Nasogastric tube has been removed since previous study.  IMPRESSION: No significant change in cardiomegaly and diffuse interstitial infiltrates, suspicious for interstitial edema.  Stable atelectasis versus infiltrate and left retrocardiac lung base.   Electronically Signed   By: Earle Gell M.D.   On: 01/14/2014 11:20   Scheduled Meds: . albuterol  2.5 mg Nebulization TID  . antiseptic oral rinse  15 mL Mouth Rinse BID  . aspirin EC  81  mg Oral Daily  . budesonide-formoterol  2 puff Inhalation BID  . calcium carbonate  1 tablet Oral TID WC  . carvedilol  12.5 mg Oral BID WC  . cefTRIAXone (ROCEPHIN)  IV  1 g Intravenous Q24H  . darbepoetin  25 mcg Subcutaneous Q14 Days  . feeding supplement (RESOURCE BREEZE)  1 Container Oral TID BM  . fentaNYL  50 mcg Transdermal Q72H  . fluticasone  2 spray Each Nare Daily  . furosemide  40 mg Oral BID  . heparin subcutaneous  5,000 Units Subcutaneous 3 times per day  . insulin aspart  0-15 Units Subcutaneous TID WC  . Vitamin D (Ergocalciferol)  50,000 Units Oral Q7 days   Continuous Infusions:  PRN Meds:.acetaminophen, albuterol, artificial tears, morphine injection, phenol   Antibiotics: Anti-infectives   Start     Dose/Rate Route Frequency Ordered Stop   01/14/14 1100  cefTRIAXone (ROCEPHIN) 1 g in dextrose 5 % 50 mL IVPB     1 g 100 mL/hr over 30 Minutes Intravenous Every 24 hours 01/14/14 1013     01/13/14 1400  levofloxacin (LEVAQUIN) tablet 500 mg  Status:  Discontinued     500 mg Oral Every 48 hours 01/13/14 1225 01/14/14 1013   01/10/14 1400  ceFAZolin (ANCEF) IVPB 2 g/50 mL premix  Status:  Discontinued     2 g 100 mL/hr over 30 Minutes Intravenous 3 times per day 01/10/14 1223 01/10/14 1243   01/10/14 1400  [MAR Hold]  ceFAZolin (ANCEF) 3 g in dextrose 5 % 50 mL IVPB     (On MAR Hold since 01/10/14 1328)   3 g 160 mL/hr over 30 Minutes Intravenous On call to O.R. 01/10/14 1245 01/10/14 1415      Assessment/Plan POD#5 exploratory laparotomy, LOA, partial collection, colostomy and repair of incarcerated hernia---Dr. Ninfa Linden 01/10/14 Post op wound hematoma -tolerating diet, ostomy with adequate output, pain well controlled.  Stable for discharge to SNF from surgical standpoint. -BID wet to dry dressing changes to abdominal wound -total 7 days of antibiotics, may change to PO at discharge -follow up arranged   Erby Pian, [facility]  Surgery Pager [phone] Office [phone]  01/15/2014 9:18 AM

## 2014-01-15 NOTE — Discharge Summary (Signed)
Physician Discharge Summary  SHANEY DECKMAN XTK:[id number] DOB: December 10, 1928 DOA: 01/04/2014  PCP: Cleda Mccreedy, MD  Admit date: 01/04/2014 Discharge date: 01/15/2014  Time spent: 60 minutes  Recommendations for Outpatient Follow-up:  1. Blood glucose check qac and hs. Resume lantus if needed 2. Monitor blood pressure. Resume norvasc if needed 3. Routine colostomy care 4. Also, See discharge instructions below.  Discharge Diagnoses:    Incarcerated ventral hernia Active Problems:   DIABETES MELLITUS   Morbid obesity   ANEMIA, IRON DEFICIENCY, CHRONIC   HYPERTENSION   AORTIC STENOSIS, MILD   GERD   Dehydration   Acute renal failure   CKD (chronic kidney disease), stage III   Acute on chronic respiratory failure with hypoxia   Adynamic ileus   Leukocytosis, unspecified   Dyspnea   UTI (urinary tract infection): culture pending Postoperative wound hematoma  Discharge Condition: stable  Code status: DNR  Filed Weights   01/05/14 0316 01/06/14 0530 01/11/14 0427  Weight: 157.398 kg (347 lb) 161.481 kg (356 lb) 161.7 kg (356 lb 7.7 oz)    History of present illness:  80 year old morbidly obese female patient w/ a long-standing ventral hernia and recurrent abdominal pain. Patient was initially sent to [facility] from her skilled nursing facility after worsening abdominal pain over 24-48 hours. Imaging done at that time was concerning for possible incarcerated hernia. Because of her high-risk status for future surgical procedures she was refused admission by general surgery at that facility. Her case and clinical findings were discussed with General Surgery at [facility] and the patient was subsequently accepted to this hospital for medical admission with surgical consultation.  After arrival she was also evaluated by General Surgery. It was noted that prior to arrival the patient passed a large BM and expelled significant amount of flatus with improvement in her abdominal  exam with decreased tenderness. It was felt that there was no evidence of ischemia and no evidence of obstruction. She was given supporitve care, and eventually transferred out of SDU on 7/13, however on 7/14-she was noted to be Short of breath, had worsening ileus on Xray, NGT was placed, patient transferred back to SDU for closer monitoring. Dr. Ninfa Linden performed exploratory laparotomy, LOA, partial collection, colostomy and repair of incarcerated hernia on 7/15  Acute on chronic Resp Failure-hypoxia  On chronic oxygen. Had CXR showing pulmonary edema and lasix given with resolution of dyspnea.  Echo not done, as likely poor acoustic windows due to extreme morbid obesity. Was on lasix PTA, which has been resumed  Hypernatremia  Resolved on hypotonic IVF   Leukocytosis  CXR showed expiratory films so difficult to interpret. UA showed UTI. Received levaquin, then subsequently rocephin. Culture pending. Change to ceftin for 5 more days, to complete a total course of 7 days.  Foley catheter has been removed  Incarcerated ventral hernia (transient)/ Adynamic ileus- s/p ex lap with lysis of adhesions and colostomy  Tolerating solids. stooling into colostomy bag. Postoperative wound hematoma opened today and surgery recommends wet to dry dressings. F/u with Dr. Ninfa Linden (see below)  Dehydration  Resolved  Acute renal failure versus progression of Chronic kidney disease, stage III  -Baseline BUN and creatinine dating back to 2013 24/1.73. Has ranged 2 - 3 here  DIABETES MELLITUS  controlled off lantus. This has been held. May resume at SNF if CBGs increase significantly. Otherwise, has been controlled on sliding scale NovoLog.  ANEMIA, IRON DEFICIENCY, CHRONIC  -Baseline hemoglobin around 10.8 as of 2013 - after admission  Hgb drifted down to 6.8 - transfused 2 units packed red blood cells. Hemoglobin at discharge is 8.0.  Hx of HYPERTENSION  Norvasc has been stopped and patient remains  normotensive. Carvedilol resumed.  Protein calorie malnutrition in the setting of extreme morbid obesity Albumin level I.7 with a total protein of 4.8   Morbid obesity - Body mass index is 61.08 kg/(m^2).   AORTIC STENOSIS, MILD   GERD   Procedures: exploratory laparotomy, LOA, partial collection, colostomy and repair of incarcerated hernia---Dr. Ninfa Linden 01/10/14  Consultations:  General surgery  Discharge Exam: Filed Vitals:   01/15/14 1039  BP: 136/58  Pulse: 86  Temp: 98.7 F (37.1 C)  Resp: 18    General: Comfortable Cardiovascular: Regular rate rhythm without murmurs gallops rubs Respiratory: clear to auscultation bilaterally without wheezes rhonchi or rales Abdomen soft, appropriately tender. Dressing clean and dry. Colostomy bag with stool. Extremities with edema  Discharge Instructions   Diet - low sodium heart healthy    Complete by:  As directed      Diet Carb Modified    Complete by:  As directed      Discharge wound care:    Complete by:  As directed   Wet to dry dessings bid     Increase activity slowly    Complete by:  As directed             Medication List    STOP taking these medications       amLODipine 10 MG tablet  Commonly known as:  NORVASC     BIOFREEZE 4 % Gel  Generic drug:  Menthol (Topical Analgesic)     docusate sodium 100 MG capsule  Commonly known as:  COLACE     fentaNYL 75 MCG/HR  Commonly known as:  DURAGESIC - dosed mcg/hr  Replaced by:  fentaNYL 50 MCG/HR     fexofenadine 30 MG tablet  Commonly known as:  ALLEGRA     gabapentin 100 MG capsule  Commonly known as:  NEURONTIN     guaiFENesin 600 MG 12 hr tablet  Commonly known as:  MUCINEX     insulin glargine 100 UNIT/ML injection  Commonly known as:  LANTUS     lidocaine 5 %  Commonly known as:  LIDODERM     polyethylene glycol packet  Commonly known as:  MIRALAX / GLYCOLAX     sitaGLIPtin 25 MG tablet  Commonly known as:  JANUVIA      TAKE these  medications       acetaminophen 500 MG tablet  Commonly known as:  TYLENOL  Take 1,000 mg by mouth every 6 (six) hours as needed for mild pain.     ARTIFICIAL TEARS OP  Place 1 drop into both eyes 3 (three) times daily.     aspirin 81 MG EC tablet  Take 1 tablet (81 mg total) by mouth daily.     budesonide-formoterol 160-4.5 MCG/ACT inhaler  Commonly known as:  SYMBICORT  Inhale 2 puffs into the lungs 2 (two) times daily.     calcium carbonate 500 MG chewable tablet  Commonly known as:  TUMS - dosed in mg elemental calcium  Chew 1 tablet by mouth 3 (three) times daily with meals.     carvedilol 12.5 MG tablet  Commonly known as:  COREG  Take 12.5 mg by mouth 2 (two) times daily with a meal.     cefUROXime 500 MG tablet  Commonly known as:  CEFTIN  Take 1  tablet (500 mg total) by mouth 2 (two) times daily with a meal. Through 01/20/14, then stop     epoetin alfa 2000 UNIT/ML injection  Commonly known as:  EPOGEN,PROCRIT  Inject 2,000 Units into the skin once a week. Saturday.     feeding supplement (RESOURCE BREEZE) Liqd  Take 1 Container by mouth 3 (three) times daily between meals.     fentaNYL 50 MCG/HR  Commonly known as:  DURAGESIC - dosed mcg/hr  Place 1 patch (50 mcg total) onto the skin every 3 (three) days.     ferrous sulfate 325 (65 FE) MG tablet  Take 325 mg by mouth daily with breakfast.     fluticasone 50 MCG/ACT nasal spray  Commonly known as:  FLONASE  Place 2 sprays into both nostrils daily.     furosemide 40 MG tablet  Commonly known as:  LASIX  Take 40 mg by mouth 2 (two) times daily.     HYDROcodone-acetaminophen 10-325 MG per tablet  Commonly known as:  NORCO  Take 1 tablet by mouth every 6 (six) hours as needed for moderate pain.     ipratropium-albuterol 0.5-2.5 (3) MG/3ML Soln  Commonly known as:  DUONEB  Take 3 mLs by nebulization every 6 (six) hours as needed (shortness of breath or wheezing).     Vitamin D (Ergocalciferol) 50000 UNITS  Caps capsule  Commonly known as:  DRISDOL  Take 50,000 Units by mouth every 7 (seven) days. On Sunday.       No Known Allergies     Follow-up Information   Follow up with [facility], MD On 02/01/2014. (arrive no later than 2:50PM)    Specialty:  General Surgery   Contact information:   [address] [address] [phone]        The results of significant diagnostics from this hospitalization (including imaging, microbiology, ancillary and laboratory) are listed below for reference.    Significant Diagnostic Studies: Ct Abdomen Pelvis Wo Contrast  01/09/2014   CLINICAL DATA:  Dilated colon.  EXAM: CT ABDOMEN AND PELVIS WITHOUT CONTRAST  TECHNIQUE: Multidetector CT imaging of the abdomen and pelvis was performed following the standard protocol without IV contrast.  COMPARISON:  Abdominal radiograph January 09, 2014 and CT of the abdomen pelvis September 25, 2011  FINDINGS: Large body habitus results in overall noisy image quality. Mild motion degraded examination.  LUNG BASES: Included view of the lung bases demonstrates prominent pulmonary vessels, patchy airspace opacities without pleural effusions. The heart appears at least mildly and large, included pericardium is nonsuspicious. The visualized heart and pericardium are unremarkable.  KIDNEYS/BLADDER: Kidneys are orthotopic, demonstrating normal size and morphology. 6 mm right lower pole nephrolithiasis No hydronephrosis; limited assessment for renal masses on this nonenhanced examination. The unopacified ureters are normal in course and caliber. Urinary bladder is decompressed containing a Foley catheter.  SOLID ORGANS: The liver, spleen, pancreas and adrenal glands are unremarkable for this non-contrast examination. Status post cholecystectomy.  GASTROINTESTINAL TRACT: Cecum and ascending colon dilated to at least 10 cm with air-fluid level, colon large broad dilatation to the level of the transverse colon with  short-segment entering ventral hernia, with transition point. Hernia neck is 4.3 cm. Small amount of air in the colon distal to the hernia sac. No definite inflammatory changes of the hernia sac though, limited by body habitus. Stomach decompressed with nasogastric tube looped in proximal stomach with distal tip in mid stomach. Small bowel is dilated at 3.9 cm  with air-fluid level.  PERITONEUM/RETROPERITONEUM: No intraperitoneal free fluid nor free air. Aortoiliac vessels are normal in course and caliber, mild calcific atherosclerosis. No lymphadenopathy by CT size criteria. Status post hysterectomy.  SOFT TISSUES/ OSSEOUS STRUCTURES: Nonsuspicious. Suture material along the right anterior abdominal wall. Severe bilateral hip osteoarthrosis. Fatty atrophy of the pelvic muscular tear, recommend correlation with ambulation. Limited assessment of the spine.  IMPRESSION: Incomplete large bowel obstruction, short segment of transverse colon extends into a ventral hernia (4.3 cm neck) with transition point. No CT findings of incarceration.  Partially imaged patchy airspace opacities in right lung base which could reflect pulmonary edema or even pneumonia. Recommend clinical correlation.   Electronically Signed   By: Elon Alas   On: 01/09/2014 19:22   Dg Chest 1 View  01/09/2014   CLINICAL DATA:  Shortness of breath  EXAM: CHEST - 1 VIEW  COMPARISON:  None.  FINDINGS: A nasogastric catheter is coiled within the stomach. The cardiac shadow is enlarged. The lungs are free of acute infiltrate or sizable effusion. No pneumothorax is. Mild bibasilar atelectatic changes are seen.  IMPRESSION: Nasogastric catheter within the stomach.  Bibasilar atelectasis.   Electronically Signed   By: Inez Catalina M.D.   On: 01/09/2014 19:18   Dg Chest Port 1 View  01/14/2014   CLINICAL DATA:  Dyspnea.  COPD.  Congestive heart failure.  EXAM: PORTABLE CHEST - 1 VIEW  COMPARISON:  01/11/2014  FINDINGS: Low lung volumes again  demonstrated. Cardiomegaly is stable. Diffuse interstitial infiltrates again seen, suspicious for mild interstitial edema. Mild atelectasis or infiltrate in the left retrocardiac lung base also shows no significant change. Nasogastric tube has been removed since previous study.  IMPRESSION: No significant change in cardiomegaly and diffuse interstitial infiltrates, suspicious for interstitial edema.  Stable atelectasis versus infiltrate and left retrocardiac lung base.   Electronically Signed   By: Earle Gell M.D.   On: 01/14/2014 11:20   Dg Chest Port 1 View  01/11/2014   CLINICAL DATA:  SOB  EXAM: PORTABLE CHEST - 1 VIEW  COMPARISON:  01/09/2014  FINDINGS: Low lung volumes. Slight increase in the coarse perihilar and bibasilar interstitial and airspace opacities left greater than right. Heart size upper limits normal for technique. Nasogastric tube loops in the stomach. No effusion. Visualized skeletal structures are unremarkable.  IMPRESSION: 1. Persistent low volumes with some increase in bibasilar atelectasis or infiltrates left greater than right   Electronically Signed   By: Arne Cleveland M.D.   On: 01/11/2014 14:23   Dg Abd 2 Views  01/09/2014   CLINICAL DATA:  Hernia.  Ileus.  EXAM: ABDOMEN - 2 VIEW  COMPARISON:  01/07/2014  FINDINGS: There is diffuse colonic dilation with air-fluid levels on the decubitus view. Findings are consistent with a diffuse colonic adynamic ileus. A distal colonic obstruction is also possible but felt less likely. There is no free air. Suture material and vascular clips are noted in the right upper quadrant, unchanged. The calcification described on the prior study to the right of the lower lumbar spine is also unchanged most likely a phlebolith.  IMPRESSION: 1. Findings are consistent with a diffuse colonic adynamic ileus. Colon distention has increased from the prior study. A distal colonic obstruction is not excluded but felt less likely. No free air.   Electronically  Signed   By: Lajean Manes M.D.   On: 01/09/2014 09:29   Dg Abd Portable 1v  01/07/2014   CLINICAL DATA:  Evaluate bowel gas  pattern.  EXAM: PORTABLE ABDOMEN - 1 VIEW  COMPARISON:  01/04/2014; CT abdomen pelvis - 09/25/2011.  FINDINGS: The exam in is markedly degraded due to patient body habitus and portable technique.  There is mild to moderate gas distention of predominantly the colon, unchanged minimally improved in the interval. There is no definitive gas distention of the small bowel. Nondiagnostic evaluation for pneumoperitoneum secondary supine positioning and patient respiratory artifact degrading evaluation of the lower thorax. No definite pneumatosis or portal venous gas. Multiple surgical clips overlie the right upper abdomen. Punctate calcification overlying the medial aspect of the right mid abdomen likely represents the gonadal vein phlebolith demonstrated on prior abdominal CT.  IMPRESSION: Degraded examination with no change to minimally improved appearance of suspected ileus.   Electronically Signed   By: Sandi Mariscal M.D.   On: 01/07/2014 12:42   Dg Abd Portable 1v  01/05/2014   CLINICAL DATA:  Bowel obstruction.  EXAM: PORTABLE ABDOMEN - 1 VIEW  COMPARISON:  CT abdomen and pelvis 09/25/2011  FINDINGS: Technically limited study due to patient body habitus. Abdomen is not entirely included within the field of view. There appears to be diffuse gaseous distention of bowel, probably colon. Appearance is most consistent with adynamic ileus. Surgical clips in the right upper quadrant.  IMPRESSION: Technically limited study. Gaseous distention of bowel most likely representing colon. Consider adynamic ileus.   Electronically Signed   By: Lucienne Capers M.D.   On: 01/05/2014 00:57   Dg Abd Portable 2v  01/10/2014   CLINICAL DATA:  Abdominal pain; obese patient  EXAM: PORTABLE ABDOMEN - 2 VIEW  COMPARISON:  CT scan of the abdomen and pelvis of 09 January 2014  FINDINGS: The study is quite limited due  to the patient's body habitus. There are loops of distended colon present. There are surgical clips in the right upper quadrant. No definite free extraluminal gas is demonstrated.  IMPRESSION: Very limited study demonstrated distended colonic loops consistent with known obstruction.   Electronically Signed   By: David  Martinique   On: 01/10/2014 07:35    Microbiology: No results found for this or any previous visit (from the past 240 hour(s)).   Labs: Basic Metabolic Panel:  Recent Labs Lab 01/10/14 0443 01/11/14 0359 01/12/14 0810 01/12/14 2350 01/14/14 1042  NA 146 152* 149* 143 143  K 4.6 4.1 3.8 3.4* 3.6*  CL 110 116* 115* 108 107  CO2 19 19 20 19 20   GLUCOSE 96 121* 120* 193* 166*  BUN 73* 73* 77* 76* 70*  CREATININE 2.44* 2.48* 3.00* 2.93* 2.88*  CALCIUM 8.1* 7.4* 7.2* 7.2* 7.0*   Liver Function Tests: No results found for this basename: AST, ALT, ALKPHOS, BILITOT, PROT, ALBUMIN,  in the last 168 hours No results found for this basename: LIPASE, AMYLASE,  in the last 168 hours No results found for this basename: AMMONIA,  in the last 168 hours CBC:  Recent Labs Lab 01/10/14 0443 01/11/14 0359 01/12/14 0810 01/12/14 2350 01/14/14 1042  WBC 8.5 20.0* 23.0* 23.0* 21.6*  HGB 10.1* 9.6* 8.1* 7.6* 8.0*  HCT 31.7* 31.0* 25.6* 24.1* 25.3*  MCV 91.9 95.7 94.5 94.1 93.4  PLT 271 229 197 185 175   Cardiac Enzymes: No results found for this basename: CKTOTAL, CKMB, CKMBINDEX, TROPONINI,  in the last 168 hours BNP: BNP (last 3 results)  Recent Labs  01/14/14 1042  PROBNP 9139.0*   CBG:  Recent Labs Lab 01/14/14 0738 01/14/14 1200 01/14/14 1634 01/14/14 2211 01/15/14 2376  GLUCAP 125* 140* 156* 141* 137*   Signed:  Nebo  [facility] 01/15/2014, 11:10 AM

## 2014-01-15 NOTE — Plan of Care (Signed)
Problem: Phase I Progression Outcomes Goal: Initial discharge plan identified Outcome: Adequate for Discharge Patient is bedbound at baseline. Goal: Voiding-avoid urinary catheter unless indicated Outcome: Adequate for Discharge Foley removed prior to discharge.

## 2014-01-15 NOTE — Progress Notes (Signed)
I have seen and examined the pt and agree with NP-Riebock's progress note. WTD BID midline wound, no signs of infection

## 2014-01-15 NOTE — Care Management Note (Signed)
CARE MANAGEMENT NOTE 01/15/2014  Patient:  Brandi [name], Brandi [name]   Account Number:  [id number]  Date Initiated:  01/05/2014  Documentation initiated by:  Shaun Zuccaro  Subjective/Objective Assessment:   adm w hernia, renal failure     Action/Plan:   lives alone, pcp dr a Eula Fried  01/15/2014 Pt is resident of [facility] and will return to that facility.   Anticipated DC Date:  01/15/2014   Anticipated DC Plan:  SKILLED NURSING FACILITY         Choice offered to / List presented to:             Status of service:   Medicare Important Message given?  YES (If response is "NO", the following Medicare IM given date fields will be blank) Date Medicare IM given:  01/08/2014 Medicare IM given by:  Elissa Hefty Date Additional Medicare IM given:  01/15/2014 Additional Medicare IM given by:  [facility]  Discharge Disposition:  [place]  Per UR Regulation:  Reviewed for med. necessity/level of care/duration of stay  If discussed at [place] of Stay Meetings, dates discussed:   01/09/2014  01/11/2014    Comments:   01/15/2014 Pt for d/c today, is a long term resident of [facility] and will return to that facility. This CM met with pt who is in agreement to return to [facility] and IM letter was given to pt and options expained to pt . Pt verbalized understanding and is aware of plan to d/c. Jasmine Pang RN MPH, case manager, [phone]  01/11/14 [place] RN MSN BSN CCM Per [place], pt is from [facility] and will return when medically stable.  01/10/14 1004 [place] MSN BSN CCM Pt transferred from [facility] to [place] 2/2 SOB and worsening ileus per xray, placement of NG.  O2 sats 95-100% on 2-4L/min [place]. Surgical intervention scheduled for today.

## 2014-01-15 NOTE — Clinical Social Work Note (Addendum)
Patient medically stable for discharge back to [facility] facility today. Discharge summary transmitted to facility and patient's daughter Corrin Parker ([phone]) contacted to inform her of patient's discharge. Patient transported to facility via ambulance.  Elzy Tomasello Givens, MSW, LCSW [phone]

## 2014-01-15 NOTE — Consult Note (Signed)
WOC ostomy follow-up consult note Stoma type/location:  Pt received colostomy to left lower quad on 7/15.   Stomal assessment/size: Stoma 60% yellow slough, 40% red, slightly above skin level, 2.5 inches and oval Peristomal assessment: intact skin surrounding, one skin tear at 11 o'clock. Output 100cc dark brown thick liquid stool. Ostomy pouching: 1pc. with barrier ring to maintain seal, convex pouch would be desired but not available in the room. Education provided: Pt hesitant but cooperative, not ready for teaching.  Pt encouraged to have assistance with colostomy post discharge. No family members at bedside.  Applied one piece pouch with barrier ring to maintain seal.  Stoma is large oval and pouch must be cut to the edge of the boarder on the pouch barrier.  Convex pouches may be more effective and ordered to bedside assist with maintaining seal. Recommend assistance after discharge with pouching routines; do not feel that patient will be able to be independent with pouch application and emptying. Larene Beach, BSN, RN, MSN-Student Julien Girt MSN, RN, [place], [place], [place]

## 2014-01-18 LAB — URINE CULTURE: Colony Count: >=100000

## 2014-01-22 ENCOUNTER — Telehealth (INDEPENDENT_AMBULATORY_CARE_PROVIDER_SITE_OTHER): Payer: Self-pay

## 2014-01-22 NOTE — Telephone Encounter (Signed)
N.P. Seeing pt now and calling b/c pt has staples in from sx done 7/15 by Dr Ninfa Linden. The lower part of the incision already has been getting daily wet to dry packing due to the wound seperating some. Now the upper part of the incision that still has staples in is having some yellow drainage with some seperation. Brandi [name] N.P. Is wanting to know if Dr Ninfa Linden wants her to go ahead and remove the staples so they can start packing the upper part of the incision as well. I adv. Brandi [name] that I would page Dr Ninfa Linden and call her back.  I paged Dr Ninfa Linden to ask about wound instructions and staple removal. Per Dr Ninfa Linden ok to remove staples and continue the same packing instructions as the pt has already being doing for the lower abdomen part.

## 2014-02-01 ENCOUNTER — Ambulatory Visit (INDEPENDENT_AMBULATORY_CARE_PROVIDER_SITE_OTHER): Payer: PRIVATE HEALTH INSURANCE | Admitting: Surgery

## 2014-02-01 DIAGNOSIS — Z09 Encounter for follow-up examination after completed treatment for conditions other than malignant neoplasm: Secondary | ICD-10-CM

## 2014-02-01 NOTE — Progress Notes (Signed)
Subjective:     Patient ID: Brandi [name], female   DOB: 1928-07-16, 80 y.o.   MRN: [id number]  HPI She is here for her first postop visit status post exploratory laparotomy and colostomy with colon resection for chronically obstructed colon from a hernia. She is currently in a nursing facility undergoing wet-to-dry dressing changes to her abdominal incision. Per her family's report she is doing okay. The date of surgery was July 17  Review of Systems     Objective:   Physical Exam On exam, she remains morbidly obese. Her ostomy is pink and draining well. Her midline wound is open. It is tunneling. I opened this up further with my finger and draining purulence.    Assessment:     Patient stable postop     Plan:     We will continue her local wound care. Hopefully this will improve with opening it up further. I will see her back in one month

## 2014-02-07 ENCOUNTER — Telehealth (INDEPENDENT_AMBULATORY_CARE_PROVIDER_SITE_OTHER): Payer: Self-pay

## 2014-02-07 NOTE — Telephone Encounter (Signed)
Joelene Millin nurse from [facility] [facility] was calling today to let Dr Ninfa Linden know that the family has decided to make the pt comfort care only. Joelene Millin states they are continuing to do daily dressing changes on the wound. Informed Kim that I would let Dr Ninfa Linden know. She verbalized understanding and agrees with POC.

## 2014-02-27 HISTORY — PX: COLOSTOMY: SHX63

## 2014-03-05 ENCOUNTER — Observation Stay (HOSPITAL_COMMUNITY)
Admission: EM | Admit: 2014-03-05 | Discharge: 2014-03-06 | Disposition: A | Discharge status: Home / Self Care | Payer: PRIVATE HEALTH INSURANCE | Attending: Internal Medicine | Admitting: Internal Medicine

## 2014-03-05 ENCOUNTER — Emergency Department (HOSPITAL_COMMUNITY): Payer: PRIVATE HEALTH INSURANCE

## 2014-03-05 ENCOUNTER — Encounter (HOSPITAL_COMMUNITY): Payer: Self-pay | Admitting: Emergency Medicine

## 2014-03-05 DIAGNOSIS — N183 Chronic kidney disease, stage 3 (moderate): Secondary | ICD-10-CM

## 2014-03-05 DIAGNOSIS — M519 Unspecified thoracic, thoracolumbar and lumbosacral intervertebral disc disorder: Secondary | ICD-10-CM | POA: Insufficient documentation

## 2014-03-05 DIAGNOSIS — K219 Gastro-esophageal reflux disease without esophagitis: Secondary | ICD-10-CM | POA: Diagnosis not present

## 2014-03-05 DIAGNOSIS — Z90710 Acquired absence of both cervix and uterus: Secondary | ICD-10-CM | POA: Insufficient documentation

## 2014-03-05 DIAGNOSIS — E119 Type 2 diabetes mellitus without complications: Secondary | ICD-10-CM | POA: Diagnosis not present

## 2014-03-05 DIAGNOSIS — Z7982 Long term (current) use of aspirin: Secondary | ICD-10-CM | POA: Diagnosis not present

## 2014-03-05 DIAGNOSIS — J4489 Other specified chronic obstructive pulmonary disease: Secondary | ICD-10-CM | POA: Insufficient documentation

## 2014-03-05 DIAGNOSIS — D649 Anemia, unspecified: Secondary | ICD-10-CM

## 2014-03-05 DIAGNOSIS — F3289 Other specified depressive episodes: Secondary | ICD-10-CM | POA: Insufficient documentation

## 2014-03-05 DIAGNOSIS — IMO0002 Reserved for concepts with insufficient information to code with codable children: Secondary | ICD-10-CM | POA: Diagnosis not present

## 2014-03-05 DIAGNOSIS — Z79899 Other long term (current) drug therapy: Secondary | ICD-10-CM | POA: Insufficient documentation

## 2014-03-05 DIAGNOSIS — S31109A Unspecified open wound of abdominal wall, unspecified quadrant without penetration into peritoneal cavity, initial encounter: Principal | ICD-10-CM | POA: Diagnosis present

## 2014-03-05 DIAGNOSIS — R109 Unspecified abdominal pain: Secondary | ICD-10-CM | POA: Diagnosis present

## 2014-03-05 DIAGNOSIS — I10 Essential (primary) hypertension: Secondary | ICD-10-CM

## 2014-03-05 DIAGNOSIS — Y838 Other surgical procedures as the cause of abnormal reaction of the patient, or of later complication, without mention of misadventure at the time of the procedure: Secondary | ICD-10-CM | POA: Insufficient documentation

## 2014-03-05 DIAGNOSIS — Z86718 Personal history of other venous thrombosis and embolism: Secondary | ICD-10-CM | POA: Diagnosis not present

## 2014-03-05 DIAGNOSIS — E875 Hyperkalemia: Secondary | ICD-10-CM | POA: Diagnosis not present

## 2014-03-05 DIAGNOSIS — G8929 Other chronic pain: Secondary | ICD-10-CM | POA: Insufficient documentation

## 2014-03-05 DIAGNOSIS — N179 Acute kidney failure, unspecified: Secondary | ICD-10-CM

## 2014-03-05 DIAGNOSIS — F329 Major depressive disorder, single episode, unspecified: Secondary | ICD-10-CM | POA: Insufficient documentation

## 2014-03-05 DIAGNOSIS — J449 Chronic obstructive pulmonary disease, unspecified: Secondary | ICD-10-CM | POA: Insufficient documentation

## 2014-03-05 DIAGNOSIS — L98499 Non-pressure chronic ulcer of skin of other sites with unspecified severity: Secondary | ICD-10-CM

## 2014-03-05 DIAGNOSIS — E871 Hypo-osmolality and hyponatremia: Secondary | ICD-10-CM | POA: Diagnosis present

## 2014-03-05 DIAGNOSIS — I509 Heart failure, unspecified: Secondary | ICD-10-CM | POA: Diagnosis not present

## 2014-03-05 DIAGNOSIS — S31109S Unspecified open wound of abdominal wall, unspecified quadrant without penetration into peritoneal cavity, sequela: Secondary | ICD-10-CM

## 2014-03-05 HISTORY — DX: Personal history of peptic ulcer disease: Z87.11

## 2014-03-05 HISTORY — DX: Chronic kidney disease, stage 3 unspecified: N18.30

## 2014-03-05 HISTORY — DX: Dependence on supplemental oxygen: Z99.81

## 2014-03-05 HISTORY — DX: Unspecified dementia, unspecified severity, without behavioral disturbance, psychotic disturbance, mood disturbance, and anxiety: F03.90

## 2014-03-05 HISTORY — DX: Personal history of other medical treatment: Z92.89

## 2014-03-05 HISTORY — DX: Anxiety disorder, unspecified: F41.9

## 2014-03-05 HISTORY — DX: Pneumonia, unspecified organism: J18.9

## 2014-03-05 HISTORY — DX: Type 2 diabetes mellitus without complications: E11.9

## 2014-03-05 HISTORY — DX: Chronic kidney disease, stage 3 (moderate): N18.3

## 2014-03-05 HISTORY — DX: Pressure ulcer of unspecified buttock, unspecified stage: L89.309

## 2014-03-05 HISTORY — DX: Personal history of other diseases of the digestive system: Z87.19

## 2014-03-05 HISTORY — DX: Unspecified osteoarthritis, unspecified site: M19.90

## 2014-03-05 LAB — CBC WITH DIFFERENTIAL/PLATELET
Basophils Absolute: 0 10*3/uL (ref 0.0–0.1)
Basophils Relative: 0 % (ref 0–1)
EOS ABS: 0.4 10*3/uL (ref 0.0–0.7)
Eosinophils Relative: 5 % (ref 0–5)
HEMATOCRIT: 24.9 % — AB (ref 36.0–46.0)
HEMOGLOBIN: 8 g/dL — AB (ref 12.0–15.0)
LYMPHS ABS: 2 10*3/uL (ref 0.7–4.0)
Lymphocytes Relative: 25 % (ref 12–46)
MCH: 29.5 pg (ref 26.0–34.0)
MCHC: 32.1 g/dL (ref 30.0–36.0)
MCV: 91.9 fL (ref 78.0–100.0)
MONO ABS: 0.4 10*3/uL (ref 0.1–1.0)
MONOS PCT: 5 % (ref 3–12)
NEUTROS PCT: 65 % (ref 43–77)
Neutro Abs: 5.1 10*3/uL (ref 1.7–7.7)
Platelets: 405 10*3/uL — ABNORMAL HIGH (ref 150–400)
RBC: 2.71 MIL/uL — AB (ref 3.87–5.11)
RDW: 13.9 % (ref 11.5–15.5)
WBC: 7.9 10*3/uL (ref 4.0–10.5)

## 2014-03-05 LAB — URINALYSIS, ROUTINE W REFLEX MICROSCOPIC
BILIRUBIN URINE: NEGATIVE
GLUCOSE, UA: NEGATIVE mg/dL
Ketones, ur: NEGATIVE mg/dL
Nitrite: NEGATIVE
Protein, ur: 30 mg/dL — AB
SPECIFIC GRAVITY, URINE: 1.013 (ref 1.005–1.030)
Urobilinogen, UA: 0.2 mg/dL (ref 0.0–1.0)
pH: 5 (ref 5.0–8.0)

## 2014-03-05 LAB — BASIC METABOLIC PANEL
ANION GAP: 16 — AB (ref 5–15)
Anion gap: 14 (ref 5–15)
BUN: 95 mg/dL — AB (ref 6–23)
BUN: 97 mg/dL — ABNORMAL HIGH (ref 6–23)
CHLORIDE: 91 meq/L — AB (ref 96–112)
CO2: 23 meq/L (ref 19–32)
CO2: 22 meq/L (ref 19–32)
CREATININE: 6.26 mg/dL — AB (ref 0.50–1.10)
CREATININE: 6.22 mg/dL — AB (ref 0.50–1.10)
Calcium: 9.1 mg/dL (ref 8.4–10.5)
Calcium: 9 mg/dL (ref 8.4–10.5)
Chloride: 94 mEq/L — ABNORMAL LOW (ref 96–112)
GFR calc Af Amer: 6 mL/min — ABNORMAL LOW (ref >90)
GFR calc non Af Amer: 6 mL/min — ABNORMAL LOW (ref >90)
GFR, EST AFRICAN AMERICAN: 6 mL/min — AB (ref >90)
GFR, EST NON AFRICAN AMERICAN: 6 mL/min — AB (ref >90)
Glucose, Bld: 120 mg/dL — ABNORMAL HIGH (ref 70–99)
Glucose, Bld: 106 mg/dL — ABNORMAL HIGH (ref 70–99)
POTASSIUM: 6.4 meq/L — AB (ref 3.7–5.3)
Potassium: 6.1 mEq/L — ABNORMAL HIGH (ref 3.7–5.3)
SODIUM: 132 meq/L — AB (ref 137–147)
Sodium: 128 mEq/L — ABNORMAL LOW (ref 137–147)

## 2014-03-05 LAB — MRSA PCR SCREENING: MRSA BY PCR: POSITIVE — AB

## 2014-03-05 LAB — URINE MICROSCOPIC-ADD ON

## 2014-03-05 MED ORDER — ADULT MULTIVITAMIN W/MINERALS CH
1.0000 | ORAL_TABLET | Freq: Every day | ORAL | Status: DC
  Administered 2014-03-06: 1 via ORAL
  Filled 2014-03-05: qty 1

## 2014-03-05 MED ORDER — FENTANYL CITRATE 0.05 MG/ML IJ SOLN
50.0000 ug | Freq: Once | INTRAMUSCULAR | Status: AC
  Administered 2014-03-05: 50 ug via INTRAVENOUS
  Filled 2014-03-05: qty 2

## 2014-03-05 MED ORDER — ONDANSETRON HCL 4 MG/2ML IJ SOLN
4.0000 mg | Freq: Four times a day (QID) | INTRAMUSCULAR | Status: DC | PRN
Start: 2014-03-05 — End: 2014-03-06

## 2014-03-05 MED ORDER — IPRATROPIUM-ALBUTEROL 0.5-2.5 (3) MG/3ML IN SOLN: 3.0000 mL | Freq: Four times a day (QID) | RESPIRATORY_TRACT | Status: DC | PRN

## 2014-03-05 MED ORDER — CARVEDILOL 25 MG PO TABS
25.0000 mg | ORAL_TABLET | Freq: Two times a day (BID) | ORAL | Status: DC
  Administered 2014-03-05 – 2014-03-06 (×3): 25 mg via ORAL
  Filled 2014-03-05 (×4): qty 1

## 2014-03-05 MED ORDER — FLUTICASONE PROPIONATE 50 MCG/ACT NA SUSP
2.0000 | Freq: Every day | NASAL | Status: DC
  Administered 2014-03-05 – 2014-03-06 (×2): 2 via NASAL
  Filled 2014-03-05: qty 16

## 2014-03-05 MED ORDER — HYDROCODONE-ACETAMINOPHEN 10-325 MG PO TABS
1.0000 | ORAL_TABLET | Freq: Four times a day (QID) | ORAL | Status: DC | PRN
  Administered 2014-03-05: 1 via ORAL
  Filled 2014-03-05: qty 1

## 2014-03-05 MED ORDER — SODIUM POLYSTYRENE SULFONATE 15 GM/60ML PO SUSP: 15.0000 g | ORAL | Status: DC

## 2014-03-05 MED ORDER — EPOETIN ALFA 2000 UNIT/ML IJ SOLN: 2000.0000 [IU] | INTRAMUSCULAR | Status: DC

## 2014-03-05 MED ORDER — HEPARIN SODIUM (PORCINE) 5000 UNIT/ML IJ SOLN
5000.0000 [IU] | Freq: Three times a day (TID) | INTRAMUSCULAR | Status: DC
  Administered 2014-03-05 – 2014-03-06 (×3): 5000 [IU] via SUBCUTANEOUS
  Filled 2014-03-05 (×3): qty 1

## 2014-03-05 MED ORDER — FERROUS SULFATE 325 (65 FE) MG PO TABS
325.0000 mg | ORAL_TABLET | Freq: Every day | ORAL | Status: DC
  Administered 2014-03-05 – 2014-03-06 (×2): 325 mg via ORAL
  Filled 2014-03-05 (×3): qty 1

## 2014-03-05 MED ORDER — SODIUM POLYSTYRENE SULFONATE 15 GM/60ML PO SUSP
30.0000 g | Freq: Once | ORAL | Status: AC
Start: 2014-03-05 — End: 2014-03-05
  Administered 2014-03-05: 30 g via ORAL
  Filled 2014-03-05: qty 120

## 2014-03-05 MED ORDER — VITAMIN C 500 MG PO TABS
500.0000 mg | ORAL_TABLET | Freq: Every day | ORAL | Status: DC
  Administered 2014-03-05 – 2014-03-06 (×2): 500 mg via ORAL
  Filled 2014-03-05 (×2): qty 1

## 2014-03-05 MED ORDER — PRO-STAT SUGAR FREE PO LIQD
30.0000 mL | Freq: Two times a day (BID) | ORAL | Status: DC
  Administered 2014-03-05 – 2014-03-06 (×2): 30 mL via ORAL
  Filled 2014-03-05 (×3): qty 30

## 2014-03-05 MED ORDER — ASPIRIN EC 81 MG PO TBEC
81.0000 mg | DELAYED_RELEASE_TABLET | Freq: Every day | ORAL | Status: DC
  Administered 2014-03-06: 81 mg via ORAL
  Filled 2014-03-05: qty 1

## 2014-03-05 MED ORDER — ONDANSETRON HCL 4 MG PO TABS: 4.0000 mg | ORAL_TABLET | Freq: Four times a day (QID) | ORAL | Status: DC | PRN

## 2014-03-05 MED ORDER — IOHEXOL 300 MG/ML  SOLN: 25.0000 mL | Freq: Once | INTRAMUSCULAR | Status: DC | PRN

## 2014-03-05 MED ORDER — FENTANYL 25 MCG/HR TD PT72
25.0000 ug | MEDICATED_PATCH | TRANSDERMAL | Status: DC
  Administered 2014-03-05: 25 ug via TRANSDERMAL
  Filled 2014-03-05: qty 1

## 2014-03-05 MED ORDER — MUPIROCIN 2 % EX OINT
1.0000 "application " | TOPICAL_OINTMENT | Freq: Two times a day (BID) | CUTANEOUS | Status: DC
  Administered 2014-03-05 – 2014-03-06 (×2): 1 via NASAL
  Filled 2014-03-05: qty 22

## 2014-03-05 MED ORDER — BUDESONIDE-FORMOTEROL FUMARATE 160-4.5 MCG/ACT IN AERO
2.0000 | INHALATION_SPRAY | Freq: Two times a day (BID) | RESPIRATORY_TRACT | Status: DC
  Administered 2014-03-05 – 2014-03-06 (×2): 2 via RESPIRATORY_TRACT
  Filled 2014-03-05: qty 6

## 2014-03-05 MED ORDER — VITAMIN D (ERGOCALCIFEROL) 1.25 MG (50000 UNIT) PO CAPS
50000.0000 [IU] | ORAL_CAPSULE | ORAL | Status: DC
  Administered 2014-03-06: 50000 [IU] via ORAL
  Filled 2014-03-05: qty 1

## 2014-03-05 MED ORDER — CHLORHEXIDINE GLUCONATE CLOTH 2 % EX PADS
6.0000 | MEDICATED_PAD | Freq: Every day | CUTANEOUS | Status: DC
Start: 2014-03-06 — End: 2014-03-06
  Administered 2014-03-06: 6 via TOPICAL

## 2014-03-05 MED ORDER — BOOST / RESOURCE BREEZE PO LIQD
1.0000 | Freq: Three times a day (TID) | ORAL | Status: DC
  Administered 2014-03-05 – 2014-03-06 (×3): 1 via ORAL

## 2014-03-05 MED ORDER — FUROSEMIDE 10 MG/ML IJ SOLN
40.0000 mg | Freq: Once | INTRAMUSCULAR | Status: AC
  Administered 2014-03-05: 40 mg via INTRAVENOUS
  Filled 2014-03-05: qty 4

## 2014-03-05 MED ORDER — DULOXETINE HCL 30 MG PO CPEP
30.0000 mg | ORAL_CAPSULE | Freq: Every day | ORAL | Status: DC
  Administered 2014-03-05 – 2014-03-06 (×2): 30 mg via ORAL
  Filled 2014-03-05 (×2): qty 1

## 2014-03-05 MED ORDER — CALCIUM CARBONATE ANTACID 500 MG PO CHEW
1.0000 | CHEWABLE_TABLET | Freq: Three times a day (TID) | ORAL | Status: DC
  Administered 2014-03-06 (×3): 200 mg via ORAL
  Filled 2014-03-05 (×5): qty 1

## 2014-03-05 MED ORDER — DEXTROSE 5 % IV SOLN
1.0000 g | INTRAVENOUS | Status: DC
  Administered 2014-03-05: 1 g via INTRAVENOUS
  Filled 2014-03-05 (×2): qty 10

## 2014-03-05 MED ORDER — SODIUM CHLORIDE 0.9 % IV BOLUS (SEPSIS)
250.0000 mL | Freq: Once | INTRAVENOUS | Status: AC
  Administered 2014-03-05: 250 mL via INTRAVENOUS

## 2014-03-05 MED ORDER — SODIUM CHLORIDE 0.9 % IV SOLN
INTRAVENOUS | Status: AC
  Administered 2014-03-05 (×2): via INTRAVENOUS

## 2014-03-05 MED ORDER — DEXTROSE 50 % IV SOLN
1.0000 | Freq: Once | INTRAVENOUS | Status: AC
  Administered 2014-03-05: 50 mL via INTRAVENOUS
  Filled 2014-03-05: qty 50

## 2014-03-05 MED ORDER — MULTI-VITAMIN/MINERALS PO TABS: 1.0000 | ORAL_TABLET | Freq: Every day | ORAL | Status: DC

## 2014-03-05 MED ORDER — INSULIN ASPART 100 UNIT/ML IV SOLN
10.0000 [IU] | Freq: Once | INTRAVENOUS | Status: AC
  Administered 2014-03-05: 10 [IU] via INTRAVENOUS
  Filled 2014-03-05: qty 0.1

## 2014-03-05 NOTE — ED Notes (Signed)
Denny Peon -- nurse from [facility]-- call with update--- [phone]

## 2014-03-05 NOTE — Progress Notes (Signed)
Pt admitted to room 6E22 from ED.  Pt from [facility], oriented to self.

## 2014-03-05 NOTE — Consult Note (Signed)
Brandi [name] 07/17/1928  [id number].    Requesting MD: Dr. Ripley Fraise Chief Complaint/Reason for Consult: abdominal wound with foul drainage HPI: This is an 80 yo black female with multiple medical problems who is bed bound and 2 months ago had a laparotomy with Hartman's procedure and hernia repair for incarcerated ventral hernia.  She had been seeing Dr. Ninfa Linden in the office but last month it appears [facility] called our office to inform us the patient had been made comfort care only by her family.  She was brought into the [facility] today because the facility thought she had "intestines coming out of her wound."  We have been asked to evaluate her for further recommendations.  The patient denies abdominal pain.  A history is unable to be provided by the patient.  ROS: unable to obtain due to overall decreased mental state  No family history on file.  Past Medical History  Diagnosis Date  . CHF (congestive heart failure)   . COPD (chronic obstructive pulmonary disease)   . Hypertension   . Diabetes mellitus   . Renal disorder   . Schmorl's nodes   . Muscle weakness   . Chronic pain   . GERD (gastroesophageal reflux disease)   . Chronic bronchitis   . Hypoxemia   . Depression   . Anemia   . Morbid obesity   . Shortness of breath   . DVT (deep venous thrombosis)     Past Surgical History  Procedure Laterality Date  . Cholecystectomy    . Abdominal hysterectomy    . Incisional hernia repair N/A 01/10/2014    Procedure: Exploratory Laparotomy, Lysis of Adhesions, Partial Colectomy, Colostomy and Repair of Incarcerated Hernia;  Surgeon: Harl Bowie, MD;  Location: [place];  Service: General;  Laterality: N/A;  . Colostomy      Social History:  reports that she has never smoked. She has never used smokeless tobacco. She reports that she does not drink alcohol or use illicit drugs.  Allergies: No Known Allergies   (Not in a hospital admission)  Blood  pressure 143/96, pulse 75, temperature 97.6 F (36.4 C), temperature source Oral, resp. rate 18, SpO2 96.00%. Physical Exam: General: quiet obese black female who is laying in bed in NAD HEENT: head is normocephalic, atraumatic.  Sclera are noninjected.  PERRL.  Ears and nose without any masses or lesions.  Mouth is pink and moist Heart: regular, rate, and rhythm.  Normal s1,s2. No obvious murmurs, gallops, or rubs noted.  Palpable radial and pedal pulses bilaterally Lungs: CTAB, no wheezes, rhonchi, or rales noted.  Respiratory effort nonlabored Abd: soft, NT, ND, +BS, no masses, hernias, or organomegaly.  She has a left sided colostomy with dark brown, possibly some melena present.  Midline wound is mostly clean except for one place in the middle of the wound that has thick fibrin tissue wrapped around retained PDS sutures.  This is removed and a deep hole is noted.  This has some foul smelling purulent drainage present.  This was evacuated as much as possible.  No bilious or feculent appearing output was seen.  The wound was packed.   MS: all 4 extremities are symmetrical with no cyanosis, clubbing, or edema. Skin: warm and dry with no masses, lesions, or rashes Psych: flat affect with decreased in mental acuity    Results for orders placed during the hospital encounter of 03/05/14 (from the past 48 hour(s))  BASIC METABOLIC PANEL  Status: Abnormal   Collection Time    03/05/14 11:15 AM      Result Value Ref Range   Sodium 128 (*) 137 - 147 mEq/L   Potassium 6.4 (*) 3.7 - 5.3 mEq/L   Chloride 91 (*) 96 - 112 mEq/L   CO2 23  19 - 32 mEq/L   Glucose, Bld 120 (*) 70 - 99 mg/dL   BUN 95 (*) 6 - 23 mg/dL   Creatinine, Ser 6.26 (*) 0.50 - 1.10 mg/dL   Calcium 9.1  8.4 - 10.5 mg/dL   GFR calc non Af Amer 6 (*) >90 mL/min   GFR calc Af Amer 6 (*) >90 mL/min   Comment: (NOTE)     The eGFR has been calculated using the CKD EPI equation.     This calculation has not been validated in all  clinical situations.     eGFR's persistently <90 mL/min signify possible Chronic Kidney     Disease.   Anion gap 14  5 - 15  CBC WITH DIFFERENTIAL     Status: Abnormal   Collection Time    03/05/14 11:15 AM      Result Value Ref Range   WBC 7.9  4.0 - 10.5 K/uL   RBC 2.71 (*) 3.87 - 5.11 MIL/uL   Hemoglobin 8.0 (*) 12.0 - 15.0 g/dL   HCT 24.9 (*) 36.0 - 46.0 %   MCV 91.9  78.0 - 100.0 fL   MCH 29.5  26.0 - 34.0 pg   MCHC 32.1  30.0 - 36.0 g/dL   RDW 13.9  11.5 - 15.5 %   Platelets 405 (*) 150 - 400 K/uL   Neutrophils Relative % 65  43 - 77 %   Neutro Abs 5.1  1.7 - 7.7 K/uL   Lymphocytes Relative 25  12 - 46 %   Lymphs Abs 2.0  0.7 - 4.0 K/uL   Monocytes Relative 5  3 - 12 %   Monocytes Absolute 0.4  0.1 - 1.0 K/uL   Eosinophils Relative 5  0 - 5 %   Eosinophils Absolute 0.4  0.0 - 0.7 K/uL   Basophils Relative 0  0 - 1 %   Basophils Absolute 0.0  0.0 - 0.1 K/uL   No results found.     Assessment/Plan 1. Wound infection 2. S/p exploratory laparotomy with colectomy, colostomy, and ventral hernia repair in July 3. Renal failure Patient Active Problem List   Diagnosis Date Noted  . Dyspnea 01/14/2014  . UTI (urinary tract infection) 01/14/2014  . Leukocytosis, unspecified 01/13/2014  . Incarcerated ventral hernia (transient) 01/05/2014  . Dehydration 01/05/2014  . Acute renal failure 01/05/2014  . CKD (chronic kidney disease), stage III 01/05/2014  . Acute respiratory failure with hypoxia 01/05/2014  . Adynamic ileus 01/05/2014  . Ventral hernia without mention of obstruction or gangrene 10/16/2011  . COLONIC POLYPS 05/20/2008  . DIABETES MELLITUS 05/20/2008  . Morbid obesity 05/20/2008  . ANEMIA, IRON DEFICIENCY, CHRONIC 05/20/2008  . HYPERTENSION 05/20/2008  . AORTIC STENOSIS, MILD 05/20/2008  . LUNG NODULE 05/20/2008  . GERD 05/20/2008  . PEPTIC ULCER DISEASE 05/20/2008  . HIATAL HERNIA 05/20/2008  . DIVERTICULOSIS OF COLON 05/20/2008  . ABORTION,  SPONTANEOUS 05/20/2008  . DEGENERATIVE JOINT DISEASE 05/20/2008  . PERIPHERAL EDEMA 05/20/2008  . WHEEZING 05/20/2008  . NAUSEA AND VOMITING 05/20/2008  . DIARRHEA 05/20/2008  . ABDOMINAL PAIN, CHRONIC 05/20/2008   Plan: 1. It appears based off of our office notes from last month  that the patient was made a DNR and comfort care by her family.  Her CT scan shows an open wound but no other complications.  It appears she had some infection secondary to retained fibrin in this deep hole.  We would recommend continuing NS WD dressing changes to the wound and pack in this hole as well.  No further surgical needs are present.  Defer further care to the emergency department.  Tameya Kuznia E 03/05/2014, 1:11 PM Pager: [phone]

## 2014-03-05 NOTE — H&P (Signed)
Date: 03/05/2014               Patient Name:  Brandi [name] MRN: [id number]  DOB: [birth year] Age / Sex: 80 y.o., female   PCP: Cleda Mccreedy, MD         Medical Service: Internal Medicine Teaching Service         Attending Physician: Dr. Aldine Contes, MD    First Contact: Dr. Venita Lick Pager: [phone]  Second Contact: Dr. Clayburn Pert Pager: [phone]       After Hours (After 5p/  First Contact Pager: [phone]  weekends / holidays): Second Contact Pager: [phone]   Chief Complaint: Abdominal wound drainage; brought from SNF  History of Present Illness:  Brandi [name] is an 80 yo bed-bound woman from [facility] who had a laparotomy with colectomy and colostomy due to an incarcerated ventral hernia in 12/2013. She also has COPD, hypertension, DMII, aortic stenosis, unspecified heart failure, GERD, CKD stage III, iron deficiency anemia, osteoarthritis and dementia. Her family has made her comfort care only in the last month, so her labs at her SNF and her appointments with her surgeon, Dr. Ninfa [name], have stopped.   This morning, nurses at her SNF were concerned that "intestines were coming out of her wound", so she was brought to the ED. The patient denies any abdominal pain. She has remained afebrile. She had not noticed that anything was wrong.   In the ED, a CT was performed and surgery evaluated the patient; it appears that the wound remains open and that there was an infection secondary to retained fibrin in her wound. Surgery repacked the wound. Her hyperkalemia and elevated creatinine were treated with kayexelate, IV insulin, glucose and lasix.   Meds: Current Facility-Administered Medications  Medication Dose Route Frequency Provider Last Rate Last Dose  . iohexol (OMNIPAQUE) 300 MG/ML solution 25 mL  25 mL Oral Once PRN Medication Radiologist, MD       Current Outpatient Prescriptions  Medication Sig Dispense Refill  . Amino Acids-Protein Hydrolys  (FEEDING SUPPLEMENT, PRO-STAT SUGAR FREE 64,) LIQD Take 30 mLs by mouth 2 (two) times daily.      Marland Kitchen aspirin EC 81 MG EC tablet Take 1 tablet (81 mg total) by mouth daily.      . budesonide-formoterol (SYMBICORT) 160-4.5 MCG/ACT inhaler Inhale 2 puffs into the lungs 2 (two) times daily.      . calcium carbonate (TUMS - DOSED IN MG ELEMENTAL CALCIUM) 500 MG chewable tablet Chew 1 tablet by mouth 3 (three) times daily with meals.      . carvedilol (COREG) 25 MG tablet Take 25 mg by mouth 2 (two) times daily with a meal.      . DULoxetine (CYMBALTA) 30 MG capsule Take 30 mg by mouth daily.      . feeding supplement, RESOURCE BREEZE, (RESOURCE BREEZE) LIQD Take 1 Container by mouth 3 (three) times daily between meals.    0  . fentaNYL (DURAGESIC - DOSED MCG/HR) 25 MCG/HR patch Place 25 mcg onto the skin every 3 (three) days.      . ferrous sulfate 325 (65 FE) MG tablet Take 325 mg by mouth daily with breakfast.      . fluticasone (FLONASE) 50 MCG/ACT nasal spray Place 2 sprays into both nostrils daily.      . furosemide (LASIX) 80 MG tablet Take 80 mg by mouth 2 (two) times daily.      Marland Kitchen HYDROcodone-acetaminophen (NORCO) 10-325 MG per tablet  Take 1 tablet by mouth every 6 (six) hours as needed for moderate pain.  20 tablet  0  . Hypromellose (ARTIFICIAL TEARS OP) Place 1 drop into both eyes 3 (three) times daily.       Marland Kitchen ipratropium-albuterol (DUONEB) 0.5-2.5 (3) MG/3ML SOLN Take 3 mLs by nebulization every 6 (six) hours as needed (shortness of breath or wheezing).      . Multiple Vitamins-Minerals (MULTIVITAMIN WITH MINERALS) tablet Take 1 tablet by mouth daily.      . Multiple Vitamins-Minerals (ZINC PO) Take 1 tablet by mouth daily.      . sodium polystyrene (KAYEXALATE) 15 GM/60ML suspension Take 15 g by mouth 3 (three) times a week. M W F      . vitamin C (ASCORBIC ACID) 500 MG tablet Take 500 mg by mouth daily.      . Vitamin D, Ergocalciferol, (DRISDOL) 50000 UNITS CAPS capsule Take 50,000 Units  by mouth every 7 (seven) days. On Sunday.      Marland Kitchen epoetin alfa (EPOGEN,PROCRIT) 2000 UNIT/ML injection Inject 2,000 Units into the skin once a week. Saturday.        Allergies: Allergies as of 03/05/2014  . (No Known Allergies)   Past Medical History  Diagnosis Date  . CHF (congestive heart failure)   . COPD (chronic obstructive pulmonary disease)   . Hypertension   . Diabetes mellitus   . Renal disorder   . Schmorl's nodes   . Muscle weakness   . Chronic pain   . GERD (gastroesophageal reflux disease)   . Chronic bronchitis   . Hypoxemia   . Depression   . Anemia   . Morbid obesity   . Shortness of breath   . DVT (deep venous thrombosis)    Past Surgical History  Procedure Laterality Date  . Cholecystectomy    . Abdominal hysterectomy    . Incisional hernia repair N/A 01/10/2014    Procedure: Exploratory Laparotomy, Lysis of Adhesions, Partial Colectomy, Colostomy and Repair of Incarcerated Hernia;  Surgeon: Harl Bowie, MD;  Location: [place];  Service: General;  Laterality: N/A;  . Colostomy     No family history on file. History   Social History  . Marital Status: Widowed    Spouse Name: N/A    Number of Children: N/A  . Years of Education: N/A   Occupational History  . Not on file.   Social History Main Topics  . Smoking status: Never Smoker   . Smokeless tobacco: Never Used  . Alcohol Use: No  . Drug Use: No  . Sexual Activity: Not on file   Other Topics Concern  . Not on file   Social History Narrative  . No narrative on file    Review of Systems: limited by patient's dementia; history partly gathered from SNF and daughter's histories General: patient has been feeling well, afebrile at SNF Skin: no reported rashes or bedsores HEENT: occasional headaches Cardiac: no chest pain or palpitations Respiratory: no shortness of breath GI: colostomy has been in place, abdominal wound being treated at SNF, no abdominal pain Urinary: no dysuria or  hematuria, uses diaper Psychiatric: dementia at baseline  Physical Exam: Blood pressure 159/67, pulse 72, temperature 97.6 F (36.4 C), temperature source Oral, resp. rate 13, SpO2 97.00%. Appearance: lying in dark room, sleeping HEENT: PERRL, EOMi, AT/[place] Heart: RRR, SEM heard throughout chest Lungs: difficult to manipulate patient to hear good breath sounds, but otherwise CTAB Abdomen: BS+, wound recently packed, appeared dry, nontender,  healing well, colostomy bag in place on left side of abdomen and actively collecting dark brown stool, nontender elsewhere in the abdomen as well Extremities: signs of chronic venous stasis, R>L, right appears slightly erythematous and slightly warmer, 2+ pitting edema to knees bilaterally Neurologic: patient was appropriate and conversational, able to provide some history, sensation intact, had some difficulty moving lower extremities  Lab results: Basic Metabolic Panel:  Recent Labs  03/05/14 1115  NA 128*  K 6.4*  CL 91*  CO2 23  GLUCOSE 120*  BUN 95*  CREATININE 6.26*  CALCIUM 9.1   Liver Function Tests: No results found for this basename: AST, ALT, ALKPHOS, BILITOT, PROT, ALBUMIN,  in the last 72 hours No results found for this basename: LIPASE, AMYLASE,  in the last 72 hours No results found for this basename: AMMONIA,  in the last 72 hours CBC:  Recent Labs  03/05/14 1115  WBC 7.9  NEUTROABS 5.1  HGB 8.0*  HCT 24.9*  MCV 91.9  PLT 405*    Imaging results:  Ct Abdomen Pelvis Wo Contrast  03/05/2014   CLINICAL DATA:  Status post surgical repair of incarcerated incisional hernia in July with partial colectomy and colostomy. Draining abdominal wound.  EXAM: CT ABDOMEN AND PELVIS WITHOUT CONTRAST  TECHNIQUE: Multidetector CT imaging of the abdomen and pelvis was performed following the standard protocol without IV contrast.  COMPARISON:  01/09/2014  FINDINGS: Left-sided transverse colostomy present without evidence of peristomal  hernia or bowel obstruction. Just inferior to the colostomy at the midline, a wound is present which extends to the abdominal wall musculature. There is a small amount of air tracking superiorly anterior to the abdominal wall musculature. No evidence of recurrent midline hernia. No evidence of intraperitoneal free air or abscess.  Unenhanced appearance of the liver, spleen, pancreas and adrenal glands is unremarkable. The gallbladder has been removed. There is a nonobstructing 7 mm calculus in the lower pole of the right kidney. No masses or enlarged lymph nodes are seen. The bladder is unremarkable. No hernias are identified. Bony structures are diffusely osteopenic.  IMPRESSION: 1. Midline abdominal wound without recurrent hernia. A small amount of air tracks superiorly anterior to the abdominal wall musculature. 2. Left-sided transverse colostomy without evidence of bowel obstruction or colostomy complication.   Electronically Signed   By: Aletta Edouard M.D.   On: 03/05/2014 13:34    Other results: EKG: Rate 74, nonspecific T wave abnormalities in the lateral leads  Assessment & Plan by Problem: Principal Problem:   Acute renal failure superimposed on stage 3 chronic kidney disease Active Problems:   Hyperkalemia  Ms. Tilley is an 80 yo woman here from SNF who was initially brought in over concern for her abdominal incision site, but is being admitted for management of her AKI and hyperkalmeia, with comfort care measures. In speaking with the patient's POA (her daughter), this comfort care can include fluids and antibiotics, but no long-term treatments.  Acute on Chronic Kidney Failure: last creatinine on file 12/2013 was 2.50; today creatinine is 6.26 - UA to assess for UTI; patient has risk factor of diapers - Start IVF at low rate (75 mL/hr) for 12 hours - repeat BMP this evening and tomorrow  Hyperkalemia: potassium was 6.4 today; dextrose, insulin, IVF, kayexelate (home medication)  were given in the ED - repeat BMP this evening and tomorrow  Abdominal Surgical Site: has been cleaned and repacked by surgery; remains nontender and nonerythematous, healing well. - continue normal saline  wet-to-dry dressing changes with packing as per surgery recommendations  ?RLE Cellulitis: hyperpigmented area on RLE was slightly warmer than similar finding on LLE - continue to monitor  DMII, COPD, GERD, Anemia, Hypertension, Unspecified Heart Failure, Dementia, Osteoarthritis:  - resume SNF treatments within comfort care, as defined by the patient's daughter (her POA) - home management of these conditions is through the use of: aspirin, symbicort, tums, coreg, cymbalta, epo, fentanyl patch, iron supplement, flonase, lasix, norco, artificial tears in both eyes, duonebs, vitamins, zinc, kayexalate, vitamins C and D with NO DIALYSIS  Diet:  - as per SNF, patient is on enriched meals, ground texture; start dysphagia 3 diet - resume home feeding supplements  DVT Ppx:  - [place] heparin  Dispo: Disposition is deferred at this time, awaiting improvement of current medical problems. Anticipated discharge in approximately 1-2 day(s).   The patient does have a current PCP Cleda Mccreedy, MD) and does not need an [facility] hospital follow-up appointment after discharge.  The patient does have transportation limitations that hinder transportation to clinic appointments.  Signed: Drucilla Schmidt, MD 03/05/2014, 4:07 PM

## 2014-03-05 NOTE — Progress Notes (Signed)
CRITICAL VALUE ALERT  Critical value received: positive MRSA PCR  Date of notification:  03/05/14  Time of notification:  2157  Critical value read back:Yes.    Nurse who received alert:  TY Sulamita Lafountain  Jobe Igo, RN

## 2014-03-05 NOTE — Progress Notes (Signed)
[facility] Night Float Note   - Repeat BMET at 7:50 PM remarkable for persistent hyperkalemia at 6.1. Upon chart review, patient did not receive Kayexalate in the emergency department. Additionally, patient with urine output of 400 mL status post 40 mg Lasix. Note made of patient's preference for comfort care and the avoidance of excessive procedures.  - 30 g of Kayexalate reordered. Will hold off on Lasix given minimal urine output the first dose at this point. Will not get repeat EKG and will wait until the morning for repeat blood draw.

## 2014-03-05 NOTE — ED Notes (Signed)
Bandage on abd wound removed, large amount greenish thick drainage with a suture visible. Dr Christy Gentles notified, examined pt. Wound repacked with wet to dry dressing,

## 2014-03-05 NOTE — Consult Note (Signed)
Agree with above 

## 2014-03-05 NOTE — Progress Notes (Signed)
03/05/2014 6:40 PM  In and out cath done per order.   Brandi [name]

## 2014-03-05 NOTE — ED Notes (Signed)
Dr. Donne Hazel in to see patient

## 2014-03-05 NOTE — Discharge Summary (Signed)
Name: Brandi [name] MRN: [id number] DOB: 1929-04-05 80 y.o. PCP: Cleda Mccreedy, MD  Date of Admission: 03/05/2014 11:00 AM Date of Discharge: 03/06/2014 Attending Physician: Aldine Contes, MD  Discharge Diagnosis: Principal Problem:   Acute renal failure superimposed on stage 3 chronic kidney disease Active Problems:   DIABETES MELLITUS   HYPERTENSION   Hyperkalemia   Open abdominal wall wound   Hyponatremia  Discharge Medications:   Medication List         ARTIFICIAL TEARS OP  Place 1 drop into both eyes 3 (three) times daily.     aspirin 81 MG EC tablet  Take 1 tablet (81 mg total) by mouth daily.     budesonide-formoterol 160-4.5 MCG/ACT inhaler  Commonly known as:  SYMBICORT  Inhale 2 puffs into the lungs 2 (two) times daily.     calcium carbonate 500 MG chewable tablet  Commonly known as:  TUMS - dosed in mg elemental calcium  Chew 1 tablet by mouth 3 (three) times daily with meals.     carvedilol 25 MG tablet  Commonly known as:  COREG  Take 25 mg by mouth 2 (two) times daily with a meal.     cefUROXime 250 MG tablet  Commonly known as:  CEFTIN  Take 1 tablet (250 mg total) by mouth 2 (two) times daily with a meal.     DULoxetine 30 MG capsule  Commonly known as:  CYMBALTA  Take 30 mg by mouth daily.     epoetin alfa 2000 UNIT/ML injection  Commonly known as:  EPOGEN,PROCRIT  Inject 2,000 Units into the skin once a week. Saturday.     feeding supplement (PRO-STAT SUGAR FREE 64) Liqd  Take 30 mLs by mouth 2 (two) times daily.     feeding supplement (RESOURCE BREEZE) Liqd  Take 1 Container by mouth 3 (three) times daily between meals.     fentaNYL 25 MCG/HR patch  Commonly known as:  DURAGESIC - dosed mcg/hr  Place 25 mcg onto the skin every 3 (three) days.     ferrous sulfate 325 (65 FE) MG tablet  Take 325 mg by mouth daily with breakfast.     fluticasone 50 MCG/ACT nasal spray  Commonly known as:  FLONASE  Place 2 sprays into both  nostrils daily.     furosemide 80 MG tablet  Commonly known as:  LASIX  Take 80 mg by mouth 2 (two) times daily.     HYDROcodone-acetaminophen 10-325 MG per tablet  Commonly known as:  NORCO  Take 1 tablet by mouth every 6 (six) hours as needed for moderate pain.     ipratropium-albuterol 0.5-2.5 (3) MG/3ML Soln  Commonly known as:  DUONEB  Take 3 mLs by nebulization every 6 (six) hours as needed (shortness of breath or wheezing).     multivitamin with minerals tablet  Take 1 tablet by mouth daily.     sodium polystyrene 15 GM/60ML suspension  Commonly known as:  KAYEXALATE  Take 15 g by mouth 3 (three) times a week. M W F     vitamin C 500 MG tablet  Commonly known as:  ASCORBIC ACID  Take 500 mg by mouth daily.     Vitamin D (Ergocalciferol) 50000 UNITS Caps capsule  Commonly known as:  DRISDOL  Take 50,000 Units by mouth every 7 (seven) days. On Sunday.     ZINC PO  Take 1 tablet by mouth daily.        Disposition and follow-up:  Ms.Tilly F Lacey was discharged from [facility] in [place] condition.  At the hospital follow up visit please address:  1.  Ms. Golebiewski's abdominal wound was investigated by the surgery team and cleaned and repacked. She needs wet-to-dry dressings two times a day.   While she was here, she was also found to be hyperkalemic and to have an elevated creatinine. As per instructions from her POA (her daughter), we stopped treating these abnormalities. POA is aware of the severity of her condition.  She was also found to have a UTI; this was treated for 1 day with ceftriaxone, and the patient will be sent back to her SNF on ceftin for a one week 250 mg BID course. The POA has requested antibiotic treatment within the terms of her comfort care.  Finally, the patient's sacral ulcer and lower buttock wound were evaluated by wound care; wound care recommends air mattress replacement.   2.  Labs / imaging needed at time of  follow-up: none  3.  Pending labs/ test needing follow-up: urine culture pending  Follow-up Appointments: Follow up with SNF or Dr. Otelia Sergeant as needed.  Discharge Instructions: Discharge Instructions   Diet - low sodium heart healthy    Complete by:  As directed      Increase activity slowly    Complete by:  As directed            Consultations:  none  Procedures Performed:  Ct Abdomen Pelvis Wo Contrast  03/05/2014   CLINICAL DATA:  Status post surgical repair of incarcerated incisional hernia in July with partial colectomy and colostomy. Draining abdominal wound.  EXAM: CT ABDOMEN AND PELVIS WITHOUT CONTRAST  TECHNIQUE: Multidetector CT imaging of the abdomen and pelvis was performed following the standard protocol without IV contrast.  COMPARISON:  01/09/2014  FINDINGS: Left-sided transverse colostomy present without evidence of peristomal hernia or bowel obstruction. Just inferior to the colostomy at the midline, a wound is present which extends to the abdominal wall musculature. There is a small amount of air tracking superiorly anterior to the abdominal wall musculature. No evidence of recurrent midline hernia. No evidence of intraperitoneal free air or abscess.  Unenhanced appearance of the liver, spleen, pancreas and adrenal glands is unremarkable. The gallbladder has been removed. There is a nonobstructing 7 mm calculus in the lower pole of the right kidney. No masses or enlarged lymph nodes are seen. The bladder is unremarkable. No hernias are identified. Bony structures are diffusely osteopenic.  IMPRESSION: 1. Midline abdominal wound without recurrent hernia. A small amount of air tracks superiorly anterior to the abdominal wall musculature. 2. Left-sided transverse colostomy without evidence of bowel obstruction or colostomy complication.   Electronically Signed   By: Aletta Edouard M.D.   On: 03/05/2014 13:34   Admission HPI:  Ms. Stumpo is an 80 yo bed-bound woman from  [facility] who had a laparotomy with colectomy and colostomy due to an incarcerated ventral hernia in 12/2013. She also has COPD, hypertension, DMII, aortic stenosis, unspecified heart failure, GERD, CKD stage III, iron deficiency anemia, osteoarthritis and dementia. Her family has made her comfort care only in the last month, so her labs at her SNF and her appointments with her surgeon, Dr. Ninfa Linden, have stopped.   This morning, nurses at her SNF were concerned that "intestines were coming out of her wound", so she was brought to the ED. The patient denies any abdominal pain. She has remained afebrile. She had not  noticed that anything was wrong.  In the ED, a CT was performed and surgery evaluated the patient; it appears that the wound remains open and that there was an infection secondary to retained fibrin in her wound. Surgery repacked the wound. Her hyperkalemia and elevated creatinine were treated with kayexelate, IV insulin, glucose and lasix.   Admission Physical Exam:  Blood pressure 159/67, pulse 72, temperature 97.6 F (36.4 C), temperature source Oral, resp. rate 13, SpO2 97.00%.  Appearance: lying in dark room, sleeping  HEENT: PERRL, EOMi, AT/[place]  Heart: RRR, SEM heard throughout chest  Lungs: difficult to manipulate patient to hear good breath sounds, but otherwise CTAB  Abdomen: BS+, wound recently packed, appeared dry, nontender, healing well, colostomy bag in place on left side of abdomen and actively collecting dark brown stool, nontender elsewhere in the abdomen as well  Extremities: signs of chronic venous stasis, R>L, right appears slightly erythematous and slightly warmer, 2+ pitting edema to knees bilaterally  Neurologic: patient was appropriate and conversational, able to provide some history, sensation intact, had some difficulty moving lower extremities  Hospital Course by problem list: Principal Problem:   Acute renal failure superimposed on stage 3  chronic kidney disease Active Problems:   DIABETES MELLITUS   HYPERTENSION   Hyperkalemia   Open abdominal wall wound   Hyponatremia   Ms. Bosko is an 80 yo woman admitted from SNF who was initially brought in over concern for her abdominal incision site, which was found to be intact and then was cleaned. In the ED, she was found to be hyperkalemic with an elevated creatinine, so she was admitted for management; she was then found to have a UTI.   She has comfort care measures in place and in speaking with the patient's POA (her daughter), this comfort care can include fluids and antibiotics, but no long-term treatments. Her daughter did not want further hospitalization for management of her potassium or creatinine, so the patient was discharged home to her SNF.  Acute on Chronic Kidney Failure: The patient's creatinine was 6.34, but her comfort care allows for no dialysis. She was found to have a UTI and was given ceftriaxone in the hospital and sent home on a course of ceftin.   Hyperkalemia: The patient's potassium was 6.1 today after multiple doses of kayexelate, dextrose, insulin and IVF. Patient's POA would not like any more labs drawn in the hospital and confirms that the patient is not to get dialysis. She is aware of the dangers of hyperkalemia.   Abdominal Surgical Site: the site has been cleaned and repacked by surgery; remains nontender and nonerythematous, healing well. Surgery recommends that the SNF continue normal saline wet-to-dry dressing changes with packing. The colostomy bag was leaking and was therefore replaced.   Stage 3 Sacral Ulcer and Stage 2 Lower Buttock Wound: cared for at SNF daily, but wound care recommends air mattress replacement to reduce pressure.  DMII, COPD, GERD, Anemia, Hypertension, Unspecified Heart Failure, Dementia, Osteoarthritis: the patient's other medical problems were treated with continued home medications during her hospital stay.    Discharge Vitals:   BP 131/57  Pulse 89  Temp(Src) 97.7 F (36.5 C) (Oral)  Resp 18  SpO2 100%  Discharge Labs:  Results for orders placed during the hospital encounter of 03/05/14 (from the past 24 hour(s))  URINALYSIS, ROUTINE W REFLEX MICROSCOPIC     Status: Abnormal   Collection Time    03/05/14  6:31 PM      Result  Value Ref Range   Color, Urine YELLOW  YELLOW   APPearance TURBID (*) CLEAR   Specific Gravity, Urine 1.013  1.005 - 1.030   pH 5.0  5.0 - 8.0   Glucose, UA NEGATIVE  NEGATIVE mg/dL   Hgb urine dipstick MODERATE (*) NEGATIVE   Bilirubin Urine NEGATIVE  NEGATIVE   Ketones, ur NEGATIVE  NEGATIVE mg/dL   Protein, ur 30 (*) NEGATIVE mg/dL   Urobilinogen, UA 0.2  0.0 - 1.0 mg/dL   Nitrite NEGATIVE  NEGATIVE   Leukocytes, UA LARGE (*) NEGATIVE  URINE MICROSCOPIC-ADD ON     Status: Abnormal   Collection Time    03/05/14  6:31 PM      Result Value Ref Range   Squamous Epithelial / LPF FEW (*) RARE   WBC, UA TOO NUMEROUS TO COUNT  <3 WBC/hpf   RBC / HPF 3-6  <3 RBC/hpf   Bacteria, UA MANY (*) RARE  BASIC METABOLIC PANEL     Status: Abnormal   Collection Time    03/05/14  7:50 PM      Result Value Ref Range   Sodium 132 (*) 137 - 147 mEq/L   Potassium 6.1 (*) 3.7 - 5.3 mEq/L   Chloride 94 (*) 96 - 112 mEq/L   CO2 22  19 - 32 mEq/L   Glucose, Bld 106 (*) 70 - 99 mg/dL   BUN 97 (*) 6 - 23 mg/dL   Creatinine, Ser 6.22 (*) 0.50 - 1.10 mg/dL   Calcium 9.0  8.4 - 10.5 mg/dL   GFR calc non Af Amer 6 (*) >90 mL/min   GFR calc Af Amer 6 (*) >90 mL/min   Anion gap 16 (*) 5 - 15  MRSA PCR SCREENING     Status: Abnormal   Collection Time    03/05/14  8:24 PM      Result Value Ref Range   MRSA by PCR POSITIVE (*) NEGATIVE  BASIC METABOLIC PANEL     Status: Abnormal   Collection Time    03/06/14  3:38 AM      Result Value Ref Range   Sodium 130 (*) 137 - 147 mEq/L   Potassium 6.1 (*) 3.7 - 5.3 mEq/L   Chloride 93 (*) 96 - 112 mEq/L   CO2 22  19 - 32 mEq/L    Glucose, Bld 135 (*) 70 - 99 mg/dL   BUN 95 (*) 6 - 23 mg/dL   Creatinine, Ser 6.34 (*) 0.50 - 1.10 mg/dL   Calcium 8.7  8.4 - 10.5 mg/dL   GFR calc non Af Amer 5 (*) >90 mL/min   GFR calc Af Amer 6 (*) >90 mL/min   Anion gap 15  5 - 15    Signed: Drucilla Schmidt, MD 03/06/2014, 3:16 PM    Services Ordered on Discharge: SNF Equipment Ordered on Discharge: air mattress recommended by wound care for sacral ulcer

## 2014-03-05 NOTE — ED Notes (Signed)
Received pt from [facility] with c/o pt has colostomy placed 3 months ago. Staff was doing an incision check and found some of pt intestines showing. Area with large amount of tan colored secretions.

## 2014-03-05 NOTE — ED Provider Notes (Signed)
CSN: [id number]     Arrival date & time 03/05/14  1100 History   First MD Initiated Contact with Patient 03/05/14 1110     Chief Complaint  Patient presents with  . Wound Check   Patient gave verbal permission to utilize photo for medical documentation only The image was not stored on any personal device   HPI Patient presents from nursing facility for wound check Pt has colostomy in place and it was noted this morning that the wound adjacent to the colostomy had drainage and foul smelling drainage Pt reports localized abdominal pain No fever/vomiting She also reports Headache She reports her abdominal pain is worsening  Nothing improves her abdominal pain Palpation worsens her abdominal pain  Past Medical History  Diagnosis Date  . CHF (congestive heart failure)   . COPD (chronic obstructive pulmonary disease)   . Hypertension   . Diabetes mellitus   . Renal disorder   . Schmorl's nodes   . Muscle weakness   . Chronic pain   . GERD (gastroesophageal reflux disease)   . Chronic bronchitis   . Hypoxemia   . Depression   . Anemia   . Morbid obesity   . Shortness of breath   . DVT (deep venous thrombosis)    Past Surgical History  Procedure Laterality Date  . Cholecystectomy    . Abdominal hysterectomy    . Incisional hernia repair N/A 01/10/2014    Procedure: Exploratory Laparotomy, Lysis of Adhesions, Partial Colectomy, Colostomy and Repair of Incarcerated Hernia;  Surgeon: Harl Bowie, MD;  Location: [facility];  Service: General;  Laterality: N/A;  . Colostomy     No family history on file. History  Substance Use Topics  . Smoking status: Never Smoker   . Smokeless tobacco: Never Used  . Alcohol Use: No   OB History   Grav Para Term Preterm Abortions TAB SAB Ect Mult Living                 Review of Systems  Constitutional: Negative for fever.  Gastrointestinal: Positive for abdominal pain. Negative for vomiting.  Skin: Positive for wound.  All other  systems reviewed and are negative.     Allergies  Review of patient's allergies indicates no known allergies.  Home Medications   Prior to Admission medications   Medication Sig Start Date End Date Taking? Authorizing Provider  acetaminophen (TYLENOL) 500 MG tablet Take 1,000 mg by mouth every 6 (six) hours as needed for mild pain.    Historical Provider, MD  aspirin EC 81 MG EC tablet Take 1 tablet (81 mg total) by mouth daily. 01/15/14   Delfina Redwood, MD  budesonide-formoterol (SYMBICORT) 160-4.5 MCG/ACT inhaler Inhale 2 puffs into the lungs 2 (two) times daily.    Historical Provider, MD  calcium carbonate (TUMS - DOSED IN MG ELEMENTAL CALCIUM) 500 MG chewable tablet Chew 1 tablet by mouth 3 (three) times daily with meals.    Historical Provider, MD  carvedilol (COREG) 12.5 MG tablet Take 12.5 mg by mouth 2 (two) times daily with a meal.    Historical Provider, MD  cefUROXime (CEFTIN) 500 MG tablet Take 1 tablet (500 mg total) by mouth 2 (two) times daily with a meal. Through 01/20/14, then stop 01/15/14   Delfina Redwood, MD  epoetin alfa (EPOGEN,PROCRIT) 2000 UNIT/ML injection Inject 2,000 Units into the skin once a week. Saturday.    Historical Provider, MD  feeding supplement, RESOURCE BREEZE, (RESOURCE BREEZE) LIQD Take 1  Container by mouth 3 (three) times daily between meals. 01/15/14   Delfina Redwood, MD  fentaNYL (DURAGESIC - DOSED MCG/HR) 50 MCG/HR Place 1 patch (50 mcg total) onto the skin every 3 (three) days. 01/15/14   Delfina Redwood, MD  ferrous sulfate 325 (65 FE) MG tablet Take 325 mg by mouth daily with breakfast.    Historical Provider, MD  fluticasone (FLONASE) 50 MCG/ACT nasal spray Place 2 sprays into both nostrils daily.    Historical Provider, MD  furosemide (LASIX) 40 MG tablet Take 40 mg by mouth 2 (two) times daily.     Historical Provider, MD  HYDROcodone-acetaminophen (NORCO) 10-325 MG per tablet Take 1 tablet by mouth every 6 (six) hours as needed  for moderate pain. 01/15/14   Delfina Redwood, MD  Hypromellose (ARTIFICIAL TEARS OP) Place 1 drop into both eyes 3 (three) times daily.     Historical Provider, MD  ipratropium-albuterol (DUONEB) 0.5-2.5 (3) MG/3ML SOLN Take 3 mLs by nebulization every 6 (six) hours as needed (shortness of breath or wheezing).    Historical Provider, MD  Vitamin D, Ergocalciferol, (DRISDOL) 50000 UNITS CAPS capsule Take 50,000 Units by mouth every 7 (seven) days. On Sunday.    Historical Provider, MD   BP 143/96  Pulse 75  Temp(Src) 97.6 F (36.4 C) (Oral)  Resp 18  SpO2 96% Physical Exam CONSTITUTIONAL: Well developed/well nourished HEAD: Normocephalic/atraumatic EYES: EOMI ENMT: Mucous membranes moist NECK: supple no meningeal signs CV: S1/S2 noted, no murmurs/rubs/gallops noted LUNGS: Lungs are clear to auscultation bilaterally, no apparent distress ABDOMEN: soft, diffuse tenderness, no rebound or guarding. SEE PHOTO BELOW Colostomy in place without any acute complications NEURO: Pt is awake/alert, moves all extremitiesx4 EXTREMITIES:  full ROM, pitting edema to bilatera LE SKIN: warm, color normal PSYCH: no abnormalities of mood noted       ED Course  Procedures CRITICAL CARE Performed by: Sharyon Cable Total critical care time: 30 Critical care time was exclusive of separately billable procedures and treating other patients. Critical care was necessary to treat or prevent imminent or life-threatening deterioration. Critical care was time spent personally by me on the following activities: development of treatment plan with patient and/or surrogate as well as nursing, discussions with consultants, evaluation of patient's response to treatment, examination of patient, obtaining history from patient or surrogate, ordering and performing treatments and interventions, ordering and review of laboratory studies, ordering and review of radiographic studies, pulse oximetry and re-evaluation  of patient's condition. PATIENT WITH HYPERKALEMIA AND ACUTE ON CHRONIC RENAL FAILURE REQUIRING IV INSULIN/GLUCOSE AND LASIX  11:37 AM Pt has been managed by [facility] surgery previously I have consulted them to evaluate patient 11:43 AM D/w dr Ninfa Linden with [facility] surgery He recommends CT imaging 12:34 PM hyperKalemia noted Medications ordered  will need CT imaging without IV contrast 2:39 PM D/w surgery.  Continue wound care D/w daughter - she confirms pt is a DNR but would benefit from correction of hyperkalemia and hydration to improve renal function D/w internal medicine will admit BP 159/67  Pulse 72  Temp(Src) 97.6 F (36.4 C) (Oral)  Resp 13  SpO2 97%  Labs Review Labs Reviewed  BASIC METABOLIC PANEL - Abnormal; Notable for the following:    Sodium 128 (*)    Potassium 6.4 (*)    Chloride 91 (*)    Glucose, Bld 120 (*)    BUN 95 (*)    Creatinine, Ser 6.26 (*)    GFR calc non Af Amer 6 (*)  GFR calc Af Amer 6 (*)    All other components within normal limits  CBC WITH DIFFERENTIAL - Abnormal; Notable for the following:    RBC 2.71 (*)    Hemoglobin 8.0 (*)    HCT 24.9 (*)    Platelets 405 (*)    All other components within normal limits    Imaging Review Ct Abdomen Pelvis Wo Contrast  03/05/2014   CLINICAL DATA:  Status post surgical repair of incarcerated incisional hernia in July with partial colectomy and colostomy. Draining abdominal wound.  EXAM: CT ABDOMEN AND PELVIS WITHOUT CONTRAST  TECHNIQUE: Multidetector CT imaging of the abdomen and pelvis was performed following the standard protocol without IV contrast.  COMPARISON:  01/09/2014  FINDINGS: Left-sided transverse colostomy present without evidence of peristomal hernia or bowel obstruction. Just inferior to the colostomy at the midline, a wound is present which extends to the abdominal wall musculature. There is a small amount of air tracking superiorly anterior to the abdominal wall musculature. No evidence of  recurrent midline hernia. No evidence of intraperitoneal free air or abscess.  Unenhanced appearance of the liver, spleen, pancreas and adrenal glands is unremarkable. The gallbladder has been removed. There is a nonobstructing 7 mm calculus in the lower pole of the right kidney. No masses or enlarged lymph nodes are seen. The bladder is unremarkable. No hernias are identified. Bony structures are diffusely osteopenic.  IMPRESSION: 1. Midline abdominal wound without recurrent hernia. A small amount of air tracks superiorly anterior to the abdominal wall musculature. 2. Left-sided transverse colostomy without evidence of bowel obstruction or colostomy complication.   Electronically Signed   By: Aletta Edouard M.D.   On: 03/05/2014 13:34     EKG Interpretation  Date/Time:  Monday March 05 2014 12:29:10 EDT Ventricular Rate:  74 PR Interval:    QRS Duration: 104 QT Interval:  374 QTC Calculation: 415 R Axis:   -17 Text Interpretation:  indeterminate suspect sinus but with artifact Borderline left axis deviation Abnormal R-wave progression, late transition Nonspecific T abnormalities, lateral leads Confirmed by Christy Gentles  MD, Elenore Rota ([id number]) on 03/05/2014 12:32:58 PM        MDM   Final diagnoses:  Hyperkalemia  AKI (acute kidney injury)  Open wound of abdominal wall, anterior, initial encounter    Nursing notes including past medical history and social history reviewed and considered in documentation Previous records reviewed and considered Labs/vital reviewed and considered     Sharyon Cable, MD 03/05/14 1442

## 2014-03-06 DIAGNOSIS — K219 Gastro-esophageal reflux disease without esophagitis: Secondary | ICD-10-CM

## 2014-03-06 DIAGNOSIS — M1990 Unspecified osteoarthritis, unspecified site: Secondary | ICD-10-CM

## 2014-03-06 DIAGNOSIS — I509 Heart failure, unspecified: Secondary | ICD-10-CM

## 2014-03-06 DIAGNOSIS — F0390 Unspecified dementia without behavioral disturbance: Secondary | ICD-10-CM

## 2014-03-06 DIAGNOSIS — D649 Anemia, unspecified: Secondary | ICD-10-CM

## 2014-03-06 DIAGNOSIS — E119 Type 2 diabetes mellitus without complications: Secondary | ICD-10-CM

## 2014-03-06 DIAGNOSIS — N179 Acute kidney failure, unspecified: Secondary | ICD-10-CM

## 2014-03-06 DIAGNOSIS — S31109A Unspecified open wound of abdominal wall, unspecified quadrant without penetration into peritoneal cavity, initial encounter: Secondary | ICD-10-CM | POA: Diagnosis not present

## 2014-03-06 DIAGNOSIS — J449 Chronic obstructive pulmonary disease, unspecified: Secondary | ICD-10-CM

## 2014-03-06 DIAGNOSIS — E875 Hyperkalemia: Secondary | ICD-10-CM

## 2014-03-06 DIAGNOSIS — N189 Chronic kidney disease, unspecified: Secondary | ICD-10-CM

## 2014-03-06 DIAGNOSIS — I10 Essential (primary) hypertension: Secondary | ICD-10-CM

## 2014-03-06 LAB — BASIC METABOLIC PANEL
Anion gap: 15 (ref 5–15)
BUN: 95 mg/dL — ABNORMAL HIGH (ref 6–23)
CO2: 22 meq/L (ref 19–32)
CREATININE: 6.34 mg/dL — AB (ref 0.50–1.10)
Calcium: 8.7 mg/dL (ref 8.4–10.5)
Chloride: 93 mEq/L — ABNORMAL LOW (ref 96–112)
GFR calc Af Amer: 6 mL/min — ABNORMAL LOW (ref >90)
GFR, EST NON AFRICAN AMERICAN: 5 mL/min — AB (ref >90)
Glucose, Bld: 135 mg/dL — ABNORMAL HIGH (ref 70–99)
Potassium: 6.1 mEq/L — ABNORMAL HIGH (ref 3.7–5.3)
Sodium: 130 mEq/L — ABNORMAL LOW (ref 137–147)

## 2014-03-06 MED ORDER — SODIUM POLYSTYRENE SULFONATE 15 GM/60ML PO SUSP
45.0000 g | Freq: Once | ORAL | Status: AC
  Administered 2014-03-06: 45 g via ORAL
  Filled 2014-03-06: qty 180

## 2014-03-06 MED ORDER — CEFUROXIME AXETIL 250 MG PO TABS: 250.0000 mg | ORAL_TABLET | Freq: Two times a day (BID) | ORAL | Status: AC

## 2014-03-06 MED ORDER — CETYLPYRIDINIUM CHLORIDE 0.05 % MT LIQD
7.0000 mL | Freq: Two times a day (BID) | OROMUCOSAL | Status: DC
  Administered 2014-03-06: 7 mL via OROMUCOSAL

## 2014-03-06 MED ORDER — DARBEPOETIN ALFA-POLYSORBATE 25 MCG/0.42ML IJ SOLN: 6.2500 ug | INTRAMUSCULAR | Status: DC

## 2014-03-06 NOTE — Progress Notes (Signed)
Patient Discharge: Disposition: Patient discharged to [facility] today, hemodynamically stable.  NO pain or any discomfort voiced.  Education: Patient educated on the diet, medications, and send a copy of discharge summary to the SNF.  Gave report to the nurse at the facility. IV: Removed IV before discharge. Transportation: Patient transported via ambulance with EMS staff accompanying.   Belongings: Patient took all her belongings with her.

## 2014-03-06 NOTE — Clinical Social Work Psychosocial (Signed)
Clinical Social Work Department BRIEF PSYCHOSOCIAL ASSESSMENT 03/06/2014  Patient:  Brandi [name], Brandi [name]     Account Number:  [id number]     Admit date:  03/05/2014  Clinical Social Worker:  Frederico Hamman  Date/Time:  03/06/2014 02:57 AM  Referred by:  Physician  Date Referred:  03/05/2014 Referred for  SNF Placement   Other Referral:   Interview type:  Family Other interview type:    PSYCHOSOCIAL DATA Living Status:  FACILITY Admitted from facility:  [facility] Level of care:  [place] Primary support name:  Corrin Parker Primary support relationship to patient:  CHILD, ADULT Degree of support available:   Strong support    CURRENT CONCERNS Current Concerns  Post-Acute Placement   Other Concerns:    SOCIAL WORK ASSESSMENT / PLAN Patient from [facility] and per telephone conversation with daughter on 9/8, will return to facility. Per  MD, patient medically stable for discharge today.   Assessment/plan status:  No Further Intervention Required Other assessment/ plan:   Information/referral to community resources:   None needed or requested at this time.    PATIENT'S/FAMILY'S RESPONSE TO PLAN OF CARE: Daughter appreciative of CSW's contact regarding d/c back to facility today and ambulance transport.

## 2014-03-06 NOTE — Clinical Social Work Note (Signed)
Patient ready to discharge back to [place] facility today. Facility admissions director and daughter, Corrin Parker notified. Patient will be transported back to SNF by ambulance.  Boleslaw Borghi Givens, MSW, LCSW [phone]

## 2014-03-06 NOTE — Progress Notes (Signed)
Subjective: Brandi [name] feels "fine" this morning. She is experiencing no abdominal or chest pain. Her only pain is "on her bottom".  Objective: Vital signs in last 24 hours: Filed Vitals:   03/05/14 2138 03/06/14 0536 03/06/14 0924 03/06/14 0940  BP: 119/66 144/65  131/57  Pulse: 90 89 89 89  Temp: 97.8 F (36.6 C) 98.1 F (36.7 C)  97.7 F (36.5 C)  TempSrc: Oral Oral  Oral  Resp: 18 18 19 18   SpO2: 100% 97% 96% 100%   Weight change:   Intake/Output Summary (Last 24 hours) at 03/06/14 1047 Last data filed at 03/06/14 0538  Gross per 24 hour  Intake      0 ml  Output    250 ml  Net   -250 ml   Physical Exam: Appearance: lying in bed, in no acute distress, staring at the ceiling HEENT: PERRL, EOMi, AT/[place]  Heart: RRR, SEM heard throughout chest  Lungs: difficult to manipulate patient to hear good breath sounds, but otherwise CTAB  Abdomen: BS+, wound recently packed, appeared dry, nontender, healing well, colostomy bag in place but leaking, on left side of abdomen; dark brown stool in the bag, nontender elsewhere in the abdomen as well  Back: patient has a stage 3 sacral wound and a stage 2 left lower buttock wound; both are tender to palpation Extremities: signs of chronic venous stasis, R>L, right appears slightly erythematous and slightly warmer, 2+ pitting edema to knees bilaterally  Neurologic: patient was appropriate and conversational, able to provide some history, sensation intact, had some difficulty moving lower extremities  Lab Results: Basic Metabolic Panel:  Recent Labs Lab 03/05/14 1950 03/06/14 0338  NA 132* 130*  K 6.1* 6.1*  CL 94* 93*  CO2 22 22  GLUCOSE 106* 135*  BUN 97* 95*  CREATININE 6.22* 6.34*  CALCIUM 9.0 8.7   CBC:  Recent Labs Lab 03/05/14 1115  WBC 7.9  NEUTROABS 5.1  HGB 8.0*  HCT 24.9*  MCV 91.9  PLT 405*  Urinalysis:  Recent Labs Lab 03/05/14 1831  COLORURINE YELLOW  LABSPEC 1.013  PHURINE 5.0  GLUCOSEU  NEGATIVE  HGBUR MODERATE*  BILIRUBINUR NEGATIVE  KETONESUR NEGATIVE  PROTEINUR 30*  UROBILINOGEN 0.2  NITRITE NEGATIVE  LEUKOCYTESUR LARGE*   Micro Results: Recent Results (from the past 240 hour(s))  MRSA PCR SCREENING     Status: Abnormal   Collection Time    03/05/14  8:24 PM      Result Value Ref Range Status   MRSA by PCR POSITIVE (*) NEGATIVE Final   Comment:            The GeneXpert MRSA Assay (FDA     approved for NASAL specimens     only), is one component of a     comprehensive MRSA colonization     surveillance program. It is not     intended to diagnose MRSA     infection nor to guide or     monitor treatment for     MRSA infections.     RESULT CALLED TO, READ BACK BY AND VERIFIED WITH:     T HICKS RN 2154 03/05/14 A BROWNING   Studies/Results: Ct Abdomen Pelvis Wo Contrast  03/05/2014   CLINICAL DATA:  Status post surgical repair of incarcerated incisional hernia in July with partial colectomy and colostomy. Draining abdominal wound.  EXAM: CT ABDOMEN AND PELVIS WITHOUT CONTRAST  TECHNIQUE: Multidetector CT imaging of the abdomen and pelvis was performed following  the standard protocol without IV contrast.  COMPARISON:  01/09/2014  FINDINGS: Left-sided transverse colostomy present without evidence of peristomal hernia or bowel obstruction. Just inferior to the colostomy at the midline, a wound is present which extends to the abdominal wall musculature. There is a small amount of air tracking superiorly anterior to the abdominal wall musculature. No evidence of recurrent midline hernia. No evidence of intraperitoneal free air or abscess.  Unenhanced appearance of the liver, spleen, pancreas and adrenal glands is unremarkable. The gallbladder has been removed. There is a nonobstructing 7 mm calculus in the lower pole of the right kidney. No masses or enlarged lymph nodes are seen. The bladder is unremarkable. No hernias are identified. Bony structures are diffusely osteopenic.   IMPRESSION: 1. Midline abdominal wound without recurrent hernia. A small amount of air tracks superiorly anterior to the abdominal wall musculature. 2. Left-sided transverse colostomy without evidence of bowel obstruction or colostomy complication.   Electronically Signed   By: Aletta Edouard M.D.   On: 03/05/2014 13:34   Medications: I have reviewed the patient's current medications. Scheduled Meds: . antiseptic oral rinse  7 mL Mouth Rinse BID  . aspirin EC  81 mg Oral Daily  . budesonide-formoterol  2 puff Inhalation BID  . calcium carbonate  1 tablet Oral TID WC  . carvedilol  25 mg Oral BID WC  . cefTRIAXone (ROCEPHIN)  IV  1 g Intravenous Q24H  . Chlorhexidine Gluconate Cloth  6 each Topical Q0600  . DULoxetine  30 mg Oral Daily  . [START ON 03/10/2014] epoetin alfa  2,000 Units Subcutaneous Q Sat-1800  . feeding supplement (PRO-STAT SUGAR FREE 64)  30 mL Oral BID  . feeding supplement (RESOURCE BREEZE)  1 Container Oral TID BM  . fentaNYL  25 mcg Transdermal Q72H  . ferrous sulfate  325 mg Oral Q breakfast  . fluticasone  2 spray Each Nare Daily  . heparin  5,000 Units Subcutaneous 3 times per day  . multivitamin with minerals  1 tablet Oral Daily  . mupirocin ointment  1 application Nasal BID  . vitamin C  500 mg Oral Daily  . Vitamin D (Ergocalciferol)  50,000 Units Oral Q7 days   Continuous Infusions:  PRN Meds:.HYDROcodone-acetaminophen, ipratropium-albuterol, ondansetron (ZOFRAN) IV, ondansetron Assessment/Plan: Principal Problem:   Acute renal failure superimposed on stage 3 chronic kidney disease Active Problems:   DIABETES MELLITUS   HYPERTENSION   Hyperkalemia   Open abdominal wall wound   Hyponatremia  Brandi [name] is an 80 yo woman here from SNF who was initially brought in over concern for her abdominal incision site, but was then admitted for management of her AKI and hyperkalemia; she has been found to have a UTI.   She has comfort care measures in place  and in speaking with the patient's POA (her daughter), this comfort care can include fluids and antibiotics, but no long-term treatments. Upon a second conversation today, the patient's daughter affirms that she would like her mother to receive antibiotics, but does not want further hospitalization for management of her electrolytes; she would like the patient to return to her SNF today.  Acute on Chronic Kidney Failure: last creatinine on file 12/2013 was 2.50; today creatinine is 6.34. Patient's care plan is for no dialysis. Patient's POA would not like any more labs drawn in the hospital. - UA positive for leukocytes - patient started on rocephin overnight; will be sent out on antibiotics - Continue IVF at low rate (75  mL/hr)  Hyperkalemia: potassium was 6.1 today after multiple doses of kayexelate, dextrose, insulin and IVF. Patient's POA would not like any more labs drawn in the hospital and confirms that the patient is not to get dialysis. She is aware of the dangers of hyperkalemia.  Abdominal Surgical Site: has been cleaned and repacked by surgery; remains nontender and nonerythematous, healing well.  - continue normal saline wet-to-dry dressing changes with packing as per surgery recommendations  - wound care consult recommends new colostomy bag placement and is providing supplies for nursing to replace it  Stage 3 Sacral Ulcer and Stage 2 Lower Buttock Wound: cared for at SNF daily - wound care recommends air mattress replacement to reduce pressure; will pass along to SNF  ?RLE Cellulitis: hyperpigmented area on RLE was slightly warmer than similar finding on LLE; less impressive today  DMII, COPD, GERD, Anemia, Hypertension, Unspecified Heart Failure, Dementia, Osteoarthritis:  - resume SNF treatments within comfort care, as defined by the patient's daughter (her POA)  - home management of these conditions is through the use of: aspirin, symbicort, tums, coreg, cymbalta, epo, fentanyl  patch, iron supplement, flonase, lasix, norco, artificial tears in both eyes, duonebs, vitamins, zinc, kayexalate, vitamins C and D with NO DIALYSIS   Diet:  - as per SNF, patient is on enriched meals, ground texture; start dysphagia 3 diet  - resume home feeding supplements   DVT Ppx:  - [place] heparin  Dispo: Disposition is deferred at this time, awaiting improvement of current medical problems.  Anticipated discharge in approximately 0 day(s).   The patient does have a current PCP Cleda Mccreedy, MD) and does not need an [facility] hospital follow-up appointment after discharge.  The patient does have transportation limitations that hinder transportation to clinic appointments.  .Services Needed at time of discharge: Y = Yes, Blank = No PT:   OT:   RN:   Equipment:   Other:     LOS: 1 day   Drucilla Schmidt, MD 03/06/2014, 10:47 AM

## 2014-03-06 NOTE — Progress Notes (Signed)
Utilization review completed.  

## 2014-03-06 NOTE — Consult Note (Addendum)
WOC wound consult note Reason for Consult: Consult requested for multiple wounds.  Pt familiar to [facility] team from previous admission.CCS has assessed abd full thickness wound and ordered moist dressing changes BID.  10X5X.5cm, 100% beefy red, deep tunneling area to middle of wound is 6.5cm with mod amt tan drainage and strong foul odor.  Wound type: Sacrum with stage 3 wound; 100% beefy red, 2X1X.2cm.  Pink dry scar tissue surrounding. Pt c/o large amt discomfort at site; air mattress replacement ordered for bed to reduce pressure to site and minimize pain. Left lower buttock with stage 2 wound; .3X1X.1cm, 100% red and moist, small amt yellow drainage, no odor. Pressure Ulcer POA: Yes Bilat legs with dry scabbing, no open wounds or drainage.    Colostomy from 7/15.  Pt has total assistance with pouching activities at the previous facility.  Current pouch leaking.  Applied 2 piece pouch and barrier ring to maintain seal, but pt could benefit from use of convex pouches.  Stoma red and viable, slightly above skin level, 1 3/4 inches and oval.  Mod amt dark brown stool in pouch. Supplies ordered to bedside for nurses. Please re-consult if further assistance is needed.  Thank-you,  Julien Girt MSN, [place], [place], [place], [place]

## 2014-03-06 NOTE — Progress Notes (Signed)
Pt seen and examined with Dr. Sherrine Maples. Please refer to resident note for details. In brief, Patient is a 80 y/o female with PMH of COPD, HTN, DM, AS. GERD, CKD, Anemia, OA, dementia with recent laparotomy with colectomy and colostomy for incarcerated hernia who presents for wound dehiscence and concern for "intestines coming out of wound". In ED pt was seen by surgery and had a CT. They repacked the wound in the ED. She was incidentally found to be hyperkalemic with worsening renal failure and was admitted for further w/u. Today pt feels well. No CP, no sob, no fevers.  Exam: Cardio- RRR, SEM + Lungs- CTA b/l Abd- mid abd wound +, neatly packed and dry. Colostomy bag in place - mild leak around colostomy bag, soft, non tender Ext- chronic venous stasis with pitting edema  Assessment and Plan: Pt with AKI on CKD with severe hyperkalemia and an infected abd wound s/p laparotomy/colectomy/colostomy 7/15   Hyperkalemia/AKI on CKD: - pt now with likely ESRD. Family did not want HD per nursing home. Will discuss options with family today. - At this point kayexalate is a temporizing measure and pt is not receiving lab work at the [facility] secondary to comfort care instructions. Will speak to the family about possible hospice placement v/s return to SNF - c/w kayexalate. Recheck K today  UTI - c/w rocephin for now.  - Will f/u urine cx  Abd surgical wound: - c/w dressings per sx. No further intervention for now

## 2014-03-07 NOTE — Discharge Summary (Signed)
INTERNAL MEDICINE ATTENDING DISCHARGE COSIGN   I evaluated the patient on the day of discharge and discussed the discharge plan with my resident team. I agree with the discharge documentation and disposition.   Brandi [name] 03/07/2014, 1:30 PM

## 2014-03-12 ENCOUNTER — Encounter (INDEPENDENT_AMBULATORY_CARE_PROVIDER_SITE_OTHER): Payer: PRIVATE HEALTH INSURANCE | Admitting: Surgery

## 2014-03-29 DEATH — deceased

## 2014-08-11 IMAGING — CR DG ABDOMEN 2V
3 series · 3 of 3 positions shown · non-contrast
Comparison: 01/07/2014

CLINICAL DATA: Hernia.  Ileus.

EXAM:
ABDOMEN - 2 VIEW

[w abdomen decub]
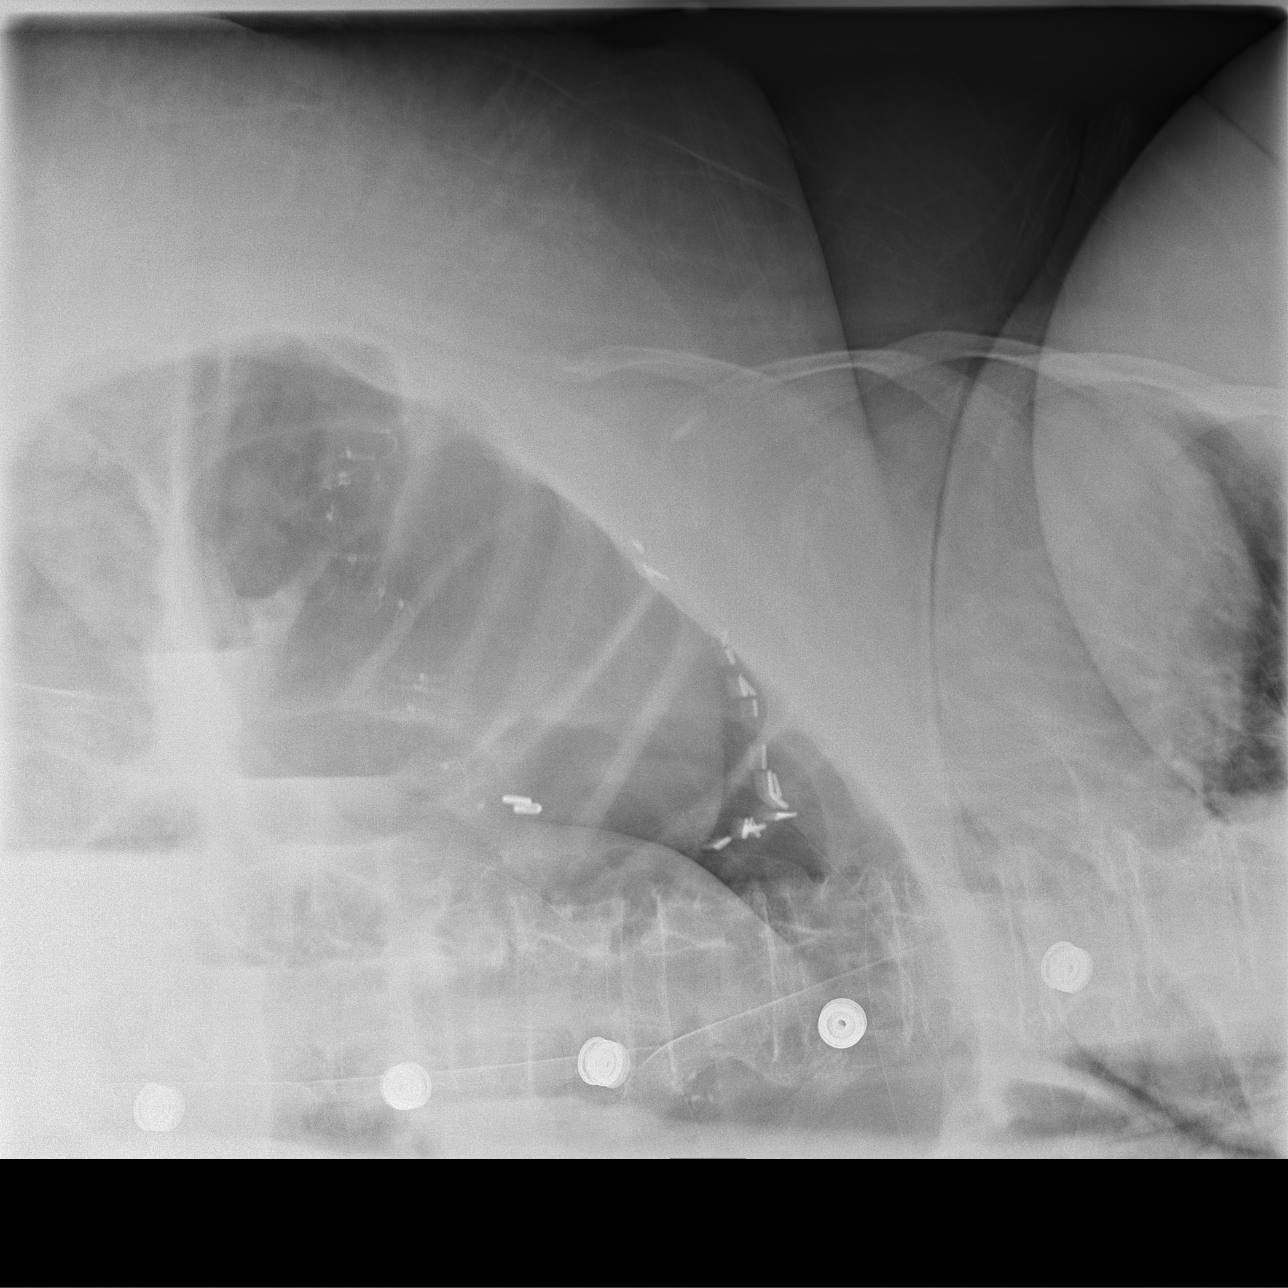

[t abdomen supine (1 of 2)]
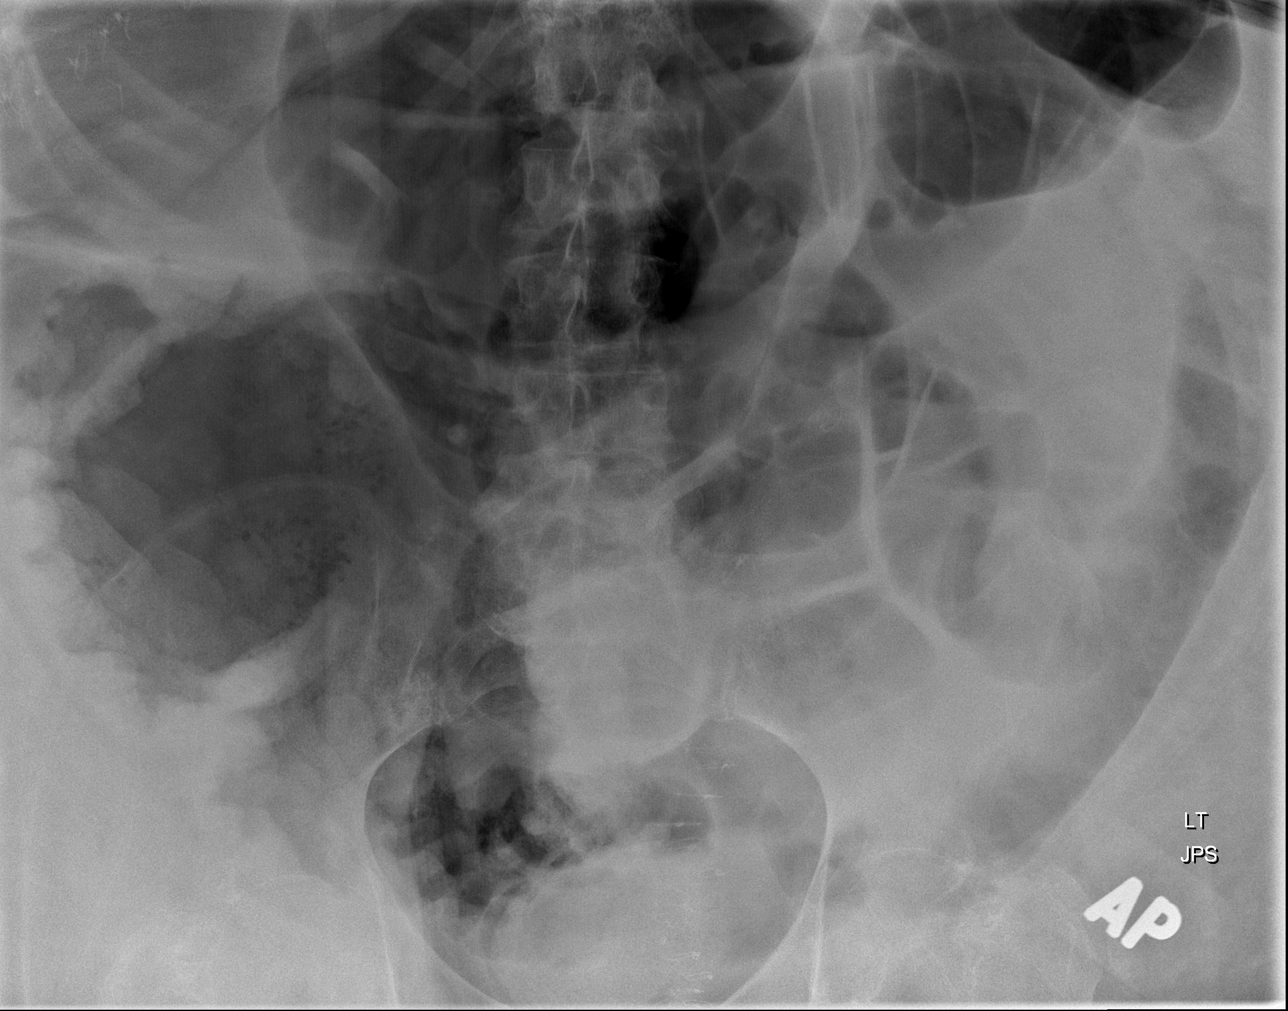

[t abdomen supine (2 of 2)]
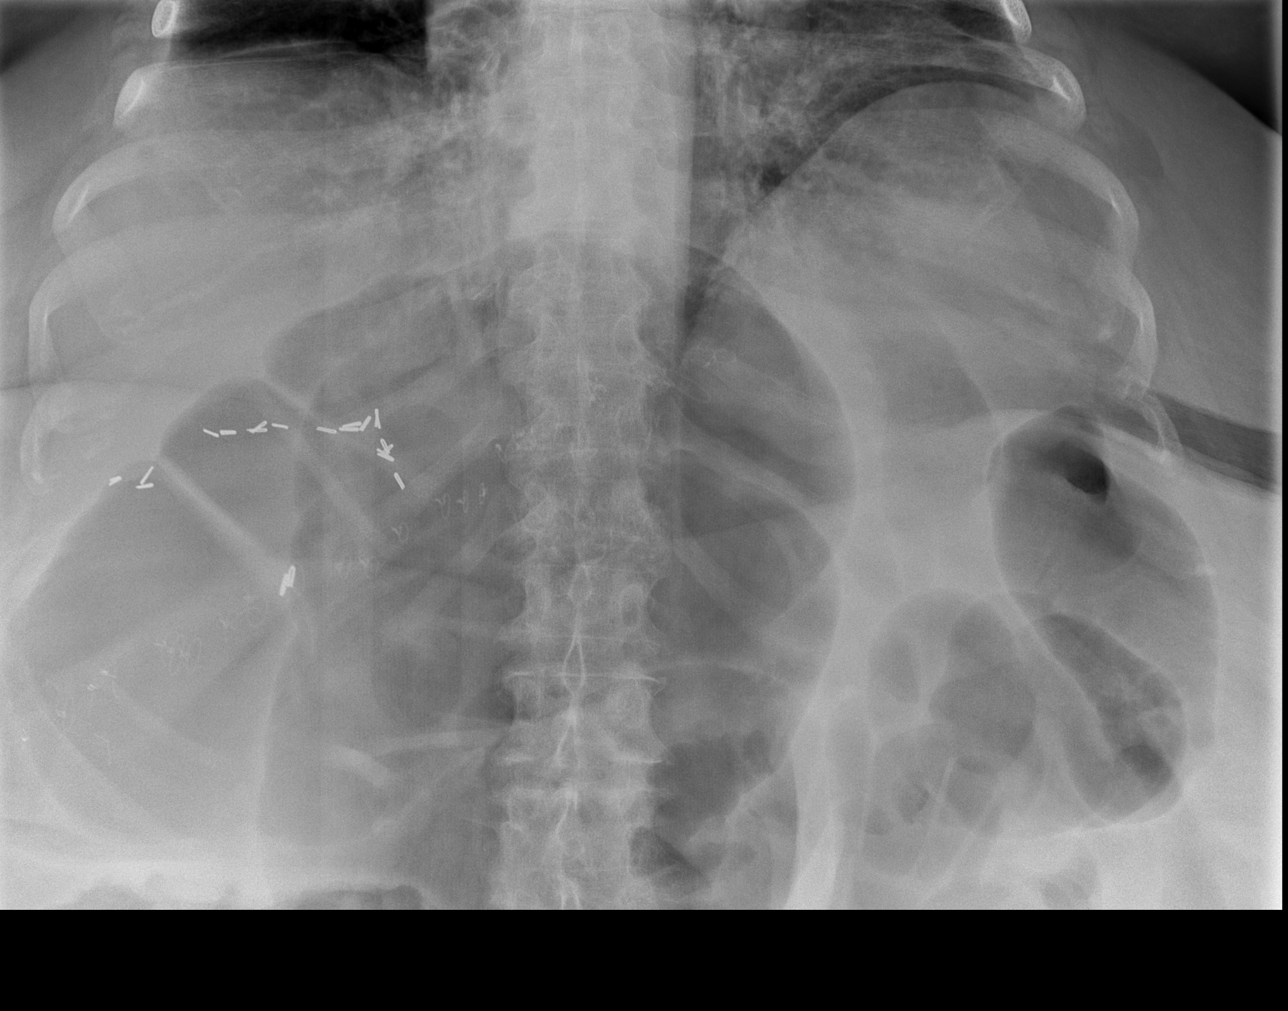

[3 of 3 positions shown; findings below may reference images not displayed]

FINDINGS: There is diffuse colonic dilation with air-fluid levels on the
decubitus view. Findings are consistent with a diffuse colonic
adynamic ileus. A distal colonic obstruction is also possible but
felt less likely. There is no free air. Suture material and vascular
clips are noted in the right upper quadrant, unchanged. The
calcification described on the prior study to the right of the lower
lumbar spine is also unchanged most likely a phlebolith.
IMPRESSION: 1. Findings are consistent with a diffuse colonic adynamic ileus.
Colon distention has increased from the prior study. A distal
colonic obstruction is not excluded but felt less likely. No free
air.
